# Patient Record
Sex: Female | Born: 1935
Health system: Southern US, Community
[De-identification: ages and names within clinical notes are randomized; demographics above are authoritative.]

## PROBLEM LIST (undated history)

## (undated) DIAGNOSIS — E7800 Pure hypercholesterolemia, unspecified: Secondary | ICD-10-CM

## (undated) DIAGNOSIS — E079 Disorder of thyroid, unspecified: Secondary | ICD-10-CM

## (undated) DIAGNOSIS — I10 Essential (primary) hypertension: Secondary | ICD-10-CM

## (undated) HISTORY — PX: HERNIA REPAIR: SHX51

## (undated) HISTORY — PX: CHOLECYSTECTOMY: SHX55

## (undated) HISTORY — PX: APPENDECTOMY: SHX54

---

## 1997-11-22 ENCOUNTER — Other Ambulatory Visit: Admission: RE | Admit: 1997-11-22 | Discharge: 1997-11-22 | Payer: Self-pay | Admitting: Obstetrics and Gynecology

## 1998-07-19 ENCOUNTER — Other Ambulatory Visit: Admission: RE | Admit: 1998-07-19 | Discharge: 1998-07-19 | Payer: Self-pay | Admitting: Obstetrics and Gynecology

## 1998-09-26 ENCOUNTER — Encounter: Payer: Self-pay | Admitting: Family Medicine

## 1998-09-26 ENCOUNTER — Ambulatory Visit (HOSPITAL_COMMUNITY): Admission: RE | Admit: 1998-09-26 | Discharge: 1998-09-26 | Payer: Self-pay | Admitting: Family Medicine

## 1998-10-25 ENCOUNTER — Encounter: Payer: Self-pay | Admitting: Surgery

## 1998-10-28 ENCOUNTER — Encounter (INDEPENDENT_AMBULATORY_CARE_PROVIDER_SITE_OTHER): Payer: Self-pay | Admitting: Specialist

## 1998-10-28 ENCOUNTER — Observation Stay (HOSPITAL_COMMUNITY): Admission: RE | Admit: 1998-10-28 | Discharge: 1998-10-29 | Payer: Self-pay | Admitting: Surgery

## 1998-12-24 ENCOUNTER — Other Ambulatory Visit: Admission: RE | Admit: 1998-12-24 | Discharge: 1998-12-24 | Payer: Self-pay | Admitting: Obstetrics and Gynecology

## 1999-12-25 ENCOUNTER — Other Ambulatory Visit: Admission: RE | Admit: 1999-12-25 | Discharge: 1999-12-25 | Payer: Self-pay | Admitting: Obstetrics and Gynecology

## 2000-12-28 ENCOUNTER — Other Ambulatory Visit: Admission: RE | Admit: 2000-12-28 | Discharge: 2000-12-28 | Payer: Self-pay | Admitting: Obstetrics and Gynecology

## 2001-02-02 ENCOUNTER — Ambulatory Visit (HOSPITAL_COMMUNITY): Admission: RE | Admit: 2001-02-02 | Discharge: 2001-02-02 | Payer: Self-pay | Admitting: Obstetrics and Gynecology

## 2001-02-02 ENCOUNTER — Encounter (INDEPENDENT_AMBULATORY_CARE_PROVIDER_SITE_OTHER): Payer: Self-pay

## 2001-05-24 ENCOUNTER — Encounter: Payer: Self-pay | Admitting: Family Medicine

## 2001-05-24 ENCOUNTER — Encounter: Admission: RE | Admit: 2001-05-24 | Discharge: 2001-05-24 | Payer: Self-pay | Admitting: Family Medicine

## 2001-08-31 ENCOUNTER — Ambulatory Visit (HOSPITAL_COMMUNITY): Admission: RE | Admit: 2001-08-31 | Discharge: 2001-08-31 | Payer: Self-pay | Admitting: Gastroenterology

## 2002-01-23 ENCOUNTER — Other Ambulatory Visit: Admission: RE | Admit: 2002-01-23 | Discharge: 2002-01-23 | Payer: Self-pay | Admitting: Obstetrics and Gynecology

## 2002-03-24 ENCOUNTER — Inpatient Hospital Stay (HOSPITAL_COMMUNITY): Admission: AD | Admit: 2002-03-24 | Discharge: 2002-03-27 | Payer: Self-pay | Admitting: Ophthalmology

## 2002-03-24 ENCOUNTER — Encounter: Payer: Self-pay | Admitting: Ophthalmology

## 2002-03-25 ENCOUNTER — Encounter: Payer: Self-pay | Admitting: Ophthalmology

## 2003-01-29 ENCOUNTER — Other Ambulatory Visit: Admission: RE | Admit: 2003-01-29 | Discharge: 2003-01-29 | Payer: Self-pay | Admitting: Obstetrics and Gynecology

## 2006-09-07 ENCOUNTER — Encounter (INDEPENDENT_AMBULATORY_CARE_PROVIDER_SITE_OTHER): Payer: Self-pay | Admitting: Surgery

## 2006-09-07 ENCOUNTER — Ambulatory Visit (HOSPITAL_COMMUNITY): Admission: RE | Admit: 2006-09-07 | Discharge: 2006-09-07 | Payer: Self-pay | Admitting: Surgery

## 2008-01-26 ENCOUNTER — Encounter: Admission: RE | Admit: 2008-01-26 | Discharge: 2008-01-26 | Payer: Self-pay | Admitting: Family Medicine

## 2008-10-05 ENCOUNTER — Encounter: Admission: RE | Admit: 2008-10-05 | Discharge: 2008-10-05 | Payer: Self-pay | Admitting: Family Medicine

## 2010-04-30 ENCOUNTER — Other Ambulatory Visit: Payer: Self-pay | Admitting: Dermatology

## 2010-06-05 ENCOUNTER — Other Ambulatory Visit: Payer: Self-pay | Admitting: Dermatology

## 2010-07-01 NOTE — Op Note (Signed)
[name], [name]                  ACCOUNT NO.:  [id number]   MEDICAL RECORD NO.:  [id number]          PATIENT TYPE:  AMB   LOCATION:  DAY                          FACILITY:  [facility]   PHYSICIAN:  Thornton Park. Daphine Deutscher, MD  DATE OF BIRTH:  [birth year]   DATE OF PROCEDURE:  09/07/2006  DATE OF DISCHARGE:                               OPERATIVE REPORT   PREOPERATIVE DIAGNOSIS:  Right inguinal hernia and recurrent lipoma of  back.   POSTOPERATIVE DIAGNOSIS:  Right indirect inguinal hernia with lipoma  back.   PROCEDURE:  Open repair right inguinal hernia with mesh and excision of  lipoma of back.   SURGEON:  Thornton Park. Daphine Deutscher, M.D.   ANESTHESIA:  General, first in the supine position and then rolled up  lateral.   DESCRIPTION OF PROCEDURE:  The patient was taken to room 1 on September 07, 2006, and given general anesthesia.  The abdomen was prepped with the  TechniCare and draped sterilely.  A small oblique incision was made and  I carried this down to the external oblique which I incised.  I  mobilized the cord structures and found the indirect hernia opened it  and then cut off the excess sac and reduced it up in the abdomen.  I  also excised the gubernaculum and tucked all that up inside.  I then  repaired the floor with a piece of mesh.  This was sewn in with  interrupted 2-0 Prolene sutures.  The external oblique was closed with a  running 2-0 Vicryl, 4-0 Vicryl was used in the subcutaneous tissue with  Benzoin and Steri-Strips.   Next, I rolled the patient over on the left side and prepped the back  and incised the previous incision and I removed multilobulated lipoma.  Bleeding was controlled.  I closed with 4-0 Vicryl and Dermabond.  The  patient can go home and will be followed up in the office in several  weeks.      Thornton Park Daphine Deutscher, MD  Electronically Signed     MBM/MEDQ  D:  09/07/2006  T:  09/07/2006  Job:  [id number]

## 2010-07-04 NOTE — Op Note (Signed)
NAMEMICKY, OVERTURF                            ACCOUNT NO.:  [id number]   MEDICAL RECORD NO.:  [id number]                   PATIENT TYPE:  OIB   LOCATION:  2550                                 FACILITY:  [facility]   PHYSICIAN:  Alford Highland. Rankin, M.D.                DATE OF BIRTH:  01-10-1931   DATE OF PROCEDURE:  03/24/2002  DATE OF DISCHARGE:                                 OPERATIVE REPORT   PREOPERATIVE DIAGNOSIS:  Dense vitreous hemorrhage of the right eye.   POSTOPERATIVE DIAGNOSES:  1. Dense vitreous hemorrhage of the right eye.  2. 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   PROCEDURE:  Pars plana vitrectomy of the right eye.   SURGEON:  Alford Highland. Rankin, M.D.   ANESTHESIA:  General endotracheal anesthesia.   INDICATION FOR PROCEDURE:  The patient is a 77 year old woman who had dense,  nonclearing vitreous hemorrhage over the last five days, unknown etiology,  who has a history of pathologic high myopia with an increase axial length of  eye and a high risk for retinal tear with occult break as an underlying  etiology for this dense, nonclearing vitreous hemorrhage.   The patient understands this is an attempt to clear the visual axis.  She  also understands the limitations of vision will be based upon the underlying  cause of her vision loss.  She also understands the risk of developing a  retinal tear or detachment even after surgery.  She understands these risks  and wishes to proceed.  She also understands the rare occurrence of death  and losses to the eye, including but not limited to hemorrhage, infection,  scarring, need for further surgery, no change in vision, loss of vision, and  progressive disease despite intervention.  Appropriate signed consent was  obtained.   DESCRIPTION OF PROCEDURE:  The patient was taken to the operating room.  In  the operating room general endotracheal anesthesia was  instituted without  difficulty.  The right periocular region was sterilely prepped and draped in  the usual ophthalmic fashion.  A lid speculum applied.  A conjunctival  peritomy fashioned temporally and superonasally.  A 4 mm infusion cannula  was then secured 3.5 mm posterior to the limbus in the inferotemporal  quadrant.  Placement in the vitreous cavity verified visually.  Superior  sclerotomies then fashioned.  Wall microscope placed in position with the  BIOM attached.  Core vitrectomy was then begun.  Notable findings were of  incarcerated hemorrhage into the vitreous base as well as of the  midvitreous.  This was removed.  Spontaneous posterior vitreous detachment  had previously occurred.  Vitreous skirt trimmed 360 degrees.  Clearance of  the preretinal hemorrhage allowed visualization of a retinal arteriolar  macroaneurysm, which apparently had ruptured, and there were signs of early  fibrosis along the superotemporal vascular arcade, with adjacent limited  intraretinal hemorrhage and subretinal hemorrhage but not into the foveal  region at this time.  Preretinal hemorrhage was aspirated.  Scleral  depression was then used inferiorly and peripherally to gently trim the  vitreous base with the vitrectomy instrumentation.  Scleral depression was  then used and indirect ophthalmoscopy was then used to confirm that there  were no retinal holes or tears.  There were no retinal holes or tears  peripherally.  No further treatment was then thus needed or required.  Vitreous wick was confirmed absent as the sclerotomies were then closed with  7-0 Vicryl suture superiorly.  The infusion removed and similarly closed  with 7-0 Vicryl suture.  Conjunctiva closed with 7-0 Vicryl suture.  Subconjunctival injection of  antibiotic and steroid applied.  The patient tolerated the procedure very  well.  A sterile patch and Fox shield applied to the right eye.  She was  then taken to the recovery  room in good and stable condition.                                                 Alford Highland Rankin, M.D.    GAR/MEDQ  D:  03/24/2002  T:  03/24/2002  Job:  [id number]

## 2010-07-04 NOTE — Procedures (Signed)
[facility]  Patient:    ILO, BEAMON Visit Number: [id number] MRN: [id number]          Service Type: END Location: ENDO Attending Physician:  Dennison Bulla Ii Dictated by:   Verlin Grills, M.D. Proc. Date: 08/31/01 Admit Date:  08/31/2001 Discharge Date: 08/31/2001   CC:         Desma Maxim, M.D.  Sherry A. Rosalio Macadamia, M.D.   Procedure Report  PROCEDURE:  Colonoscopy.  REFERRING PHYSICIANS: 1. Desma Maxim, M.D. 2. Cordelia Pen A. Rosalio Macadamia, M.D.  INDICATION FOR PROCEDURE:  Ms. Tyjah Hai is a 77 year old female born 1931-01-09. Ms. Leclaire is referred for a screening colonoscopy with polypectomy to prevent colon cancer. In June 1997, proctocolonoscopy to the cecum revealed left colonic diverticulosis but no endoscopic evidence for the presence of colorectal neoplasia.  ENDOSCOPIST:  Verlin Grills, M.D.  PREMEDICATION:  Versed 5 mg, Demerol 50 mg.  ENDOSCOPE:  Olympus pediatric colonoscope.  DESCRIPTION OF PROCEDURE:  After obtaining informed consent, the patient was placed in the left lateral decubitus position. I administered intravenous Demerol and intravenous Versed to achieve conscious sedation for the procedure. The patients cardiac rhythm, oxygen saturation and blood pressure were monitored throughout the procedure and documented in the medical record.  Anal inspection was normal. Digital rectal exam was normal. The Olympus pediatric video colonoscope was introduced into the rectum and easily advanced to the cecum. Colonic preparation for the exam today was excellent.  RECTUM:  Normal.  SIGMOID COLON/DESCENDING COLON:  Extensive colonic diverticulosis.  SPLENIC FLEXURE:  Normal.  TRANSVERSE COLON:  Normal.  HEPATIC FLEXURE:  Normal.  ASCENDING COLON:  Normal.  CECUM/ILEOCECAL VALVE:  Normal.  ASSESSMENT:  Left colonic diverticulosis; otherwise normal proctocolonoscopy to the cecum. No  endoscopic evidence for the presence of colorectal neoplasia.  RECOMMENDATIONS:  Yearly stool Hemoccult testing; if the patient has no signs of gastrointestinal bleeding or no symptoms to suggest colorectal neoplasia, she should undergo a repeat colonoscopy in approximately 10 years. Dictated by:   Verlin Grills, M.D. Attending Physician:  Dennison Bulla Ii DD:  08/31/01 TD:  09/03/01 Job: [id number]

## 2010-07-04 NOTE — Discharge Summary (Signed)
   [name], [name]                            ACCOUNT NO.:  [id number]   MEDICAL RECORD NO.:  [id number]                   PATIENT TYPE:  INP   LOCATION:  3112                                 FACILITY:  [facility]   PHYSICIAN:  Kelly Highland. [name], M.D.                DATE OF BIRTH:  Apr 14, 1930   DATE OF ADMISSION:  03/24/2002  DATE OF DISCHARGE:  03/27/2002                                 DISCHARGE SUMMARY   ADMISSION DIAGNOSIS:  Dense vitreous hemorrhage of the right eye.   DISCHARGE DIAGNOSES:  1. Dense vitreous hemorrhage of the right eye.  2. 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  ICD 9362.15.  3. Hypertension.  4. Arthritis, rheumatoid.  5. Pulmonary difficulties presumably secondary to anesthetic, postanesthetic     care and/or inadvertent injection of intravenous antibiotic.   PROCEDURE PERFORMED:  Pars plana vitrectomy with removal of dense vitreous  hemorrhage of the right eye, performed on March 24, 2002 under general  anesthesia.   LABORATORY DATA:  Early chest x-ray findings of negative pressure pulmonary  injury.  __________ the diagnosis will be delineated by the critical care  physicians.  Subsequent resolution over a 36 to 48 hour period.   COMPLICATIONS:  Reintubation of the patient in the PACU.   DISCHARGE MEDICATIONS:  1. Resumption of home medications.  2. Ocular medications will be Ocuflox, scopolamine 0.25% and Pred Forte 1%     to be used topically to the right eye four times daily each.   Wound care will be sterile patch and Fox shield at night time and optional  during the day.  Restriction and limitations of activity include no heavy  lifting, bending to prevent straining and that includes the avoidance of  constipation and these were reviewed with the patient.   Further medications per Dr. Marcos Eke are Protonix 40 mg one daily for  reflux.   Followup care will be on March 31, 2002 in the morning.              [name], M.D.    GAR/MEDQ  D:  03/27/2002  T:  03/27/2002  Job:  [id number]

## 2010-07-04 NOTE — Op Note (Signed)
[facility]  Patient:    LATOIA, EYSTER Visit Number: [id number] MRN: [id number]          Service Type: DSU Location: [facility] Attending Physician:  Morene Antu Dictated by:   Sherry A. Rosalio Macadamia, M.D. Proc. Date: 02/02/01 Admit Date:  02/02/2001                             Operative Report  PREOPERATIVE DIAGNOSES:       Postmenopausal bleeding, thickened endometrium.  POSTOPERATIVE DIAGNOSES:      Postmenopausal bleeding, thickened endometrium.  PROCEDURE:                    Dilatation and curettage, hysteroscopy with resectoscope.  SURGEON:                      Sherry A. Rosalio Macadamia, M.D.  ANESTHESIA:                   General.  INDICATIONS:                  This is a 77 year old G3, P3-0-0-3 woman who has been on hormones for 15 years.  Patient has no vaginal bleeding when she takes Prempro 5 mg; however, when she was on Prempro 2.5 mg she had spotting so from June 2002 to November 2002 she had no spotting; however, she had frequent irregular spotting from January 2002 to June 2002.  Because of a history of frequent spotting, ultrasound was performed.  Thickened endometrium was seen and therefore the patient is brought to the operating room for [facility], hysteroscopy with resectoscope.  FINDINGS:                     Normal sized anteflexed uterus with multiple polypoid areas on the lower posterior wall of the endometrium.  No adnexal mass.  PROCEDURE:                    Patient was brought into the operating room, given adequate general anesthesia.  She was placed in the dorsal lithotomy position.  Her perineum and vagina were washed with Hibiclens.  Patient was then draped in a sterile fashion.  Speculum was placed in the vagina.  A paracervical block was administered with 1% Nesacaine.  Anterior lip of the cervix was grasped with a single tooth tenaculum.  Cervix was sounded.  Cervix was dilated with Pratt dilators to a #31.   Hysteroscope was easily introduced into the endometrial cavity.  Pictures were obtained.  Using double loop right angle resector, the polypoid tissue was removed and samples of endometrial tissue were taken circumferentially.  Adequate hemostasis was present. Pictures were obtained.  All instruments were removed from the vagina. Patient was taken out of the dorsal lithotomy position.  She was awakened. She was moved from the operating table to a stretcher in stable condition.  COMPLICATIONS:                None.  ESTIMATED BLOOD LOSS:         Less than 5 cc.  FLUIDS:                       Sorbitol differential unable to measure because of most of the fluid spilling on the floor. Dictated by:   Sherry A. Rosalio Macadamia, M.D. Attending Physician:  Morene Antu  DD:  02/02/01 TD:  02/03/01 Job: [id number]

## 2010-07-04 NOTE — H&P (Signed)
   NAMEVIRGENE, [name]                            ACCOUNT NO.:  [id number]   MEDICAL RECORD NO.:  [id number]                   PATIENT TYPE:  INP   LOCATION:  3112                                 FACILITY:  [facility]   PHYSICIAN:  Alford Highland. Rankin, M.D.                DATE OF BIRTH:  12/10/1930   DATE OF ADMISSION:  03/24/2002  DATE OF DISCHARGE:                                HISTORY & PHYSICAL   ADMISSION DIAGNOSIS:  Dense vitreous hemorrhage to the right eye, etiology  unknown, but suspected underlying retinal arterial macular aneurysm of the  right eye.   PAST MEDICAL HISTORY:  1. Hypertension.  2. Arthritis, rheumatoid.   ALLERGIES:  X-ray dyes.   CURRENT MEDICATIONS:  1. Actonel once weekly on Sunday.  2. Indapamide daily for hypertension.   PAST SURGICAL HISTORY:  1. Cholecystectomy.  2. Cataract surgery of the right eye. Vision in the right motion is hand     motion. Left eye is 20/20.   PHYSICAL EXAMINATION:  VITAL SIGNS:  Blood pressure 130/80, pulse 76,  respirations 16.  GENERAL:  The patient is alert and oriented in no distress. The general  medical examination is unremarkable.   PLAN:  The surgery is for par plana vitrectomy of the right eye to be  performed on the afternoon of 03/24/02.                                               Alford Highland Rankin, M.D.    GAR/MEDQ  D:  03/27/2002  T:  03/27/2002  Job:  [id number]

## 2010-12-01 LAB — BASIC METABOLIC PANEL
Calcium: 10.2
GFR calc Af Amer: >60
GFR calc non Af Amer: >60
Potassium: 4.7
Sodium: 142

## 2010-12-01 LAB — HEMOGLOBIN AND HEMATOCRIT, BLOOD
HCT: 41.5
Hemoglobin: 14.3

## 2012-07-28 ENCOUNTER — Ambulatory Visit
Admission: RE | Admit: 2012-07-28 | Discharge: 2012-07-28 | Disposition: A | Discharge status: Home / Self Care | Payer: Medicare Other | Source: Ambulatory Visit | Attending: Family Medicine | Admitting: Family Medicine

## 2012-07-28 ENCOUNTER — Other Ambulatory Visit: Payer: Self-pay | Admitting: Family Medicine

## 2012-07-28 DIAGNOSIS — M25531 Pain in right wrist: Secondary | ICD-10-CM

## 2012-12-06 ENCOUNTER — Other Ambulatory Visit: Payer: Self-pay | Admitting: Gastroenterology

## 2013-01-25 ENCOUNTER — Other Ambulatory Visit: Payer: Self-pay | Admitting: Family Medicine

## 2014-08-03 ENCOUNTER — Other Ambulatory Visit: Payer: Self-pay | Admitting: Family Medicine

## 2014-08-03 DIAGNOSIS — N907 Vulvar cyst: Secondary | ICD-10-CM

## 2014-08-06 ENCOUNTER — Ambulatory Visit
Admission: RE | Admit: 2014-08-06 | Discharge: 2014-08-06 | Disposition: A | Discharge status: Home / Self Care | Payer: Medicare Other | Source: Ambulatory Visit | Attending: Family Medicine | Admitting: Family Medicine

## 2014-08-06 DIAGNOSIS — N907 Vulvar cyst: Secondary | ICD-10-CM

## 2015-05-01 ENCOUNTER — Ambulatory Visit
Admission: RE | Admit: 2015-05-01 | Discharge: 2015-05-01 | Disposition: A | Discharge status: Home / Self Care | Payer: Medicare Other | Source: Ambulatory Visit | Attending: Family Medicine | Admitting: Family Medicine

## 2015-05-01 ENCOUNTER — Other Ambulatory Visit: Payer: Self-pay | Admitting: Family Medicine

## 2015-05-01 DIAGNOSIS — M549 Dorsalgia, unspecified: Secondary | ICD-10-CM | POA: Diagnosis not present

## 2015-05-01 DIAGNOSIS — R002 Palpitations: Secondary | ICD-10-CM

## 2015-05-01 DIAGNOSIS — R5383 Other fatigue: Secondary | ICD-10-CM | POA: Diagnosis not present

## 2015-05-07 DIAGNOSIS — R0602 Shortness of breath: Secondary | ICD-10-CM | POA: Diagnosis not present

## 2015-05-07 DIAGNOSIS — R002 Palpitations: Secondary | ICD-10-CM | POA: Diagnosis not present

## 2015-05-07 DIAGNOSIS — R531 Weakness: Secondary | ICD-10-CM | POA: Diagnosis not present

## 2015-05-09 DIAGNOSIS — N183 Chronic kidney disease, stage 3 (moderate): Secondary | ICD-10-CM | POA: Diagnosis not present

## 2015-05-09 DIAGNOSIS — N179 Acute kidney failure, unspecified: Secondary | ICD-10-CM | POA: Diagnosis not present

## 2015-06-18 DIAGNOSIS — H40013 Open angle with borderline findings, low risk, bilateral: Secondary | ICD-10-CM | POA: Diagnosis not present

## 2015-09-30 DIAGNOSIS — Z1231 Encounter for screening mammogram for malignant neoplasm of breast: Secondary | ICD-10-CM | POA: Diagnosis not present

## 2015-11-27 DIAGNOSIS — Z23 Encounter for immunization: Secondary | ICD-10-CM | POA: Diagnosis not present

## 2015-12-09 DIAGNOSIS — R300 Dysuria: Secondary | ICD-10-CM | POA: Diagnosis not present

## 2015-12-11 DIAGNOSIS — H5213 Myopia, bilateral: Secondary | ICD-10-CM | POA: Diagnosis not present

## 2016-02-13 DIAGNOSIS — M810 Age-related osteoporosis without current pathological fracture: Secondary | ICD-10-CM | POA: Diagnosis not present

## 2016-02-13 DIAGNOSIS — K5730 Diverticulosis of large intestine without perforation or abscess without bleeding: Secondary | ICD-10-CM | POA: Diagnosis not present

## 2016-02-13 DIAGNOSIS — I129 Hypertensive chronic kidney disease with stage 1 through stage 4 chronic kidney disease, or unspecified chronic kidney disease: Secondary | ICD-10-CM | POA: Diagnosis not present

## 2016-02-13 DIAGNOSIS — Z0000 Encounter for general adult medical examination without abnormal findings: Secondary | ICD-10-CM | POA: Diagnosis not present

## 2016-02-13 DIAGNOSIS — F411 Generalized anxiety disorder: Secondary | ICD-10-CM | POA: Diagnosis not present

## 2016-02-13 DIAGNOSIS — N183 Chronic kidney disease, stage 3 (moderate): Secondary | ICD-10-CM | POA: Diagnosis not present

## 2016-02-13 DIAGNOSIS — E782 Mixed hyperlipidemia: Secondary | ICD-10-CM | POA: Diagnosis not present

## 2016-02-13 DIAGNOSIS — E039 Hypothyroidism, unspecified: Secondary | ICD-10-CM | POA: Diagnosis not present

## 2016-02-13 DIAGNOSIS — R7301 Impaired fasting glucose: Secondary | ICD-10-CM | POA: Diagnosis not present

## 2016-05-01 DIAGNOSIS — H40013 Open angle with borderline findings, low risk, bilateral: Secondary | ICD-10-CM | POA: Diagnosis not present

## 2016-06-02 DIAGNOSIS — M109 Gout, unspecified: Secondary | ICD-10-CM | POA: Diagnosis not present

## 2016-07-30 DIAGNOSIS — H40013 Open angle with borderline findings, low risk, bilateral: Secondary | ICD-10-CM | POA: Diagnosis not present

## 2016-08-27 DIAGNOSIS — R7301 Impaired fasting glucose: Secondary | ICD-10-CM | POA: Diagnosis not present

## 2016-08-27 DIAGNOSIS — E039 Hypothyroidism, unspecified: Secondary | ICD-10-CM | POA: Diagnosis not present

## 2016-08-27 DIAGNOSIS — I129 Hypertensive chronic kidney disease with stage 1 through stage 4 chronic kidney disease, or unspecified chronic kidney disease: Secondary | ICD-10-CM | POA: Diagnosis not present

## 2016-08-27 DIAGNOSIS — N183 Chronic kidney disease, stage 3 (moderate): Secondary | ICD-10-CM | POA: Diagnosis not present

## 2016-11-20 DIAGNOSIS — M8588 Other specified disorders of bone density and structure, other site: Secondary | ICD-10-CM | POA: Diagnosis not present

## 2016-11-20 DIAGNOSIS — M810 Age-related osteoporosis without current pathological fracture: Secondary | ICD-10-CM | POA: Diagnosis not present

## 2016-11-20 DIAGNOSIS — Z1231 Encounter for screening mammogram for malignant neoplasm of breast: Secondary | ICD-10-CM | POA: Diagnosis not present

## 2016-11-24 DIAGNOSIS — Z23 Encounter for immunization: Secondary | ICD-10-CM | POA: Diagnosis not present

## 2016-12-10 DIAGNOSIS — H5213 Myopia, bilateral: Secondary | ICD-10-CM | POA: Diagnosis not present

## 2016-12-29 DIAGNOSIS — M810 Age-related osteoporosis without current pathological fracture: Secondary | ICD-10-CM | POA: Diagnosis not present

## 2017-02-15 DIAGNOSIS — N183 Chronic kidney disease, stage 3 (moderate): Secondary | ICD-10-CM | POA: Diagnosis not present

## 2017-02-15 DIAGNOSIS — Z0000 Encounter for general adult medical examination without abnormal findings: Secondary | ICD-10-CM | POA: Diagnosis not present

## 2017-02-15 DIAGNOSIS — I129 Hypertensive chronic kidney disease with stage 1 through stage 4 chronic kidney disease, or unspecified chronic kidney disease: Secondary | ICD-10-CM | POA: Diagnosis not present

## 2017-02-15 DIAGNOSIS — R7301 Impaired fasting glucose: Secondary | ICD-10-CM | POA: Diagnosis not present

## 2017-05-05 DIAGNOSIS — H40013 Open angle with borderline findings, low risk, bilateral: Secondary | ICD-10-CM | POA: Diagnosis not present

## 2017-06-29 DIAGNOSIS — M810 Age-related osteoporosis without current pathological fracture: Secondary | ICD-10-CM | POA: Diagnosis not present

## 2017-08-30 DIAGNOSIS — E039 Hypothyroidism, unspecified: Secondary | ICD-10-CM | POA: Diagnosis not present

## 2017-08-30 DIAGNOSIS — I129 Hypertensive chronic kidney disease with stage 1 through stage 4 chronic kidney disease, or unspecified chronic kidney disease: Secondary | ICD-10-CM | POA: Diagnosis not present

## 2017-08-30 DIAGNOSIS — R7301 Impaired fasting glucose: Secondary | ICD-10-CM | POA: Diagnosis not present

## 2017-08-30 DIAGNOSIS — N183 Chronic kidney disease, stage 3 (moderate): Secondary | ICD-10-CM | POA: Diagnosis not present

## 2017-09-29 DIAGNOSIS — L821 Other seborrheic keratosis: Secondary | ICD-10-CM | POA: Diagnosis not present

## 2017-11-22 DIAGNOSIS — Z1231 Encounter for screening mammogram for malignant neoplasm of breast: Secondary | ICD-10-CM | POA: Diagnosis not present

## 2017-12-01 DIAGNOSIS — Z23 Encounter for immunization: Secondary | ICD-10-CM | POA: Diagnosis not present

## 2017-12-09 DIAGNOSIS — H5213 Myopia, bilateral: Secondary | ICD-10-CM | POA: Diagnosis not present

## 2018-01-03 DIAGNOSIS — M810 Age-related osteoporosis without current pathological fracture: Secondary | ICD-10-CM | POA: Diagnosis not present

## 2018-01-03 DIAGNOSIS — I129 Hypertensive chronic kidney disease with stage 1 through stage 4 chronic kidney disease, or unspecified chronic kidney disease: Secondary | ICD-10-CM | POA: Diagnosis not present

## 2018-03-10 DIAGNOSIS — R7301 Impaired fasting glucose: Secondary | ICD-10-CM | POA: Diagnosis not present

## 2018-03-10 DIAGNOSIS — N183 Chronic kidney disease, stage 3 (moderate): Secondary | ICD-10-CM | POA: Diagnosis not present

## 2018-03-10 DIAGNOSIS — I129 Hypertensive chronic kidney disease with stage 1 through stage 4 chronic kidney disease, or unspecified chronic kidney disease: Secondary | ICD-10-CM | POA: Diagnosis not present

## 2018-03-10 DIAGNOSIS — Z0000 Encounter for general adult medical examination without abnormal findings: Secondary | ICD-10-CM | POA: Diagnosis not present

## 2018-03-17 DIAGNOSIS — H40013 Open angle with borderline findings, low risk, bilateral: Secondary | ICD-10-CM | POA: Diagnosis not present

## 2018-04-04 ENCOUNTER — Other Ambulatory Visit: Payer: Self-pay

## 2018-04-04 ENCOUNTER — Emergency Department (HOSPITAL_COMMUNITY)
Admission: EM | Admit: 2018-04-04 | Discharge: 2018-04-05 | Disposition: A | Discharge status: Home / Self Care | Payer: Medicare Other | Attending: Emergency Medicine | Admitting: Emergency Medicine

## 2018-04-04 DIAGNOSIS — E860 Dehydration: Secondary | ICD-10-CM

## 2018-04-04 DIAGNOSIS — R319 Hematuria, unspecified: Secondary | ICD-10-CM | POA: Insufficient documentation

## 2018-04-04 DIAGNOSIS — R1111 Vomiting without nausea: Secondary | ICD-10-CM | POA: Diagnosis not present

## 2018-04-04 DIAGNOSIS — Z79899 Other long term (current) drug therapy: Secondary | ICD-10-CM | POA: Diagnosis not present

## 2018-04-04 DIAGNOSIS — R112 Nausea with vomiting, unspecified: Secondary | ICD-10-CM | POA: Diagnosis not present

## 2018-04-04 DIAGNOSIS — R197 Diarrhea, unspecified: Secondary | ICD-10-CM | POA: Diagnosis not present

## 2018-04-04 DIAGNOSIS — N390 Urinary tract infection, site not specified: Secondary | ICD-10-CM | POA: Insufficient documentation

## 2018-04-04 DIAGNOSIS — R110 Nausea: Secondary | ICD-10-CM | POA: Diagnosis not present

## 2018-04-04 DIAGNOSIS — I10 Essential (primary) hypertension: Secondary | ICD-10-CM | POA: Insufficient documentation

## 2018-04-04 DIAGNOSIS — E876 Hypokalemia: Secondary | ICD-10-CM

## 2018-04-04 DIAGNOSIS — R0902 Hypoxemia: Secondary | ICD-10-CM | POA: Diagnosis not present

## 2018-04-04 LAB — CBC WITH DIFFERENTIAL/PLATELET
ABS IMMATURE GRANULOCYTES: 0.03 10*3/uL (ref 0.00–0.07)
Basophils Absolute: 0 10*3/uL (ref 0.0–0.1)
Basophils Relative: 0 %
Eosinophils Absolute: 0 10*3/uL (ref 0.0–0.5)
Eosinophils Relative: 0 %
HCT: 34.5 % — ABNORMAL LOW (ref 36.0–46.0)
Hemoglobin: 11.2 g/dL — ABNORMAL LOW (ref 12.0–15.0)
Immature Granulocytes: 0 %
LYMPHS ABS: 0.6 10*3/uL — AB (ref 0.7–4.0)
LYMPHS PCT: 9 %
MCH: 30.5 pg (ref 26.0–34.0)
MCHC: 32.5 g/dL (ref 30.0–36.0)
MCV: 94 fL (ref 80.0–100.0)
MONO ABS: 0.2 10*3/uL (ref 0.1–1.0)
MONOS PCT: 3 %
NEUTROS ABS: 5.9 10*3/uL (ref 1.7–7.7)
Neutrophils Relative %: 88 %
Platelets: 158 10*3/uL (ref 150–400)
RBC: 3.67 MIL/uL — ABNORMAL LOW (ref 3.87–5.11)
RDW: 12.5 % (ref 11.5–15.5)
WBC: 6.7 10*3/uL (ref 4.0–10.5)
nRBC: 0 % (ref 0.0–0.2)

## 2018-04-04 MED ORDER — SODIUM CHLORIDE 0.9 % IV BOLUS
500.0000 mL | Freq: Once | INTRAVENOUS | Status: AC
  Administered 2018-04-04: 500 mL via INTRAVENOUS

## 2018-04-04 MED ORDER — LOPERAMIDE HCL 2 MG PO CAPS
4.0000 mg | ORAL_CAPSULE | Freq: Once | ORAL | Status: AC
  Administered 2018-04-04: 4 mg via ORAL
  Filled 2018-04-04: qty 2

## 2018-04-04 MED ORDER — SODIUM CHLORIDE 0.9 % IV BOLUS
1000.0000 mL | Freq: Once | INTRAVENOUS | Status: AC
  Administered 2018-04-04: 1000 mL via INTRAVENOUS

## 2018-04-04 MED ORDER — ONDANSETRON HCL 4 MG/2ML IJ SOLN
4.0000 mg | Freq: Once | INTRAMUSCULAR | Status: AC
  Administered 2018-04-04: 4 mg via INTRAVENOUS
  Filled 2018-04-04: qty 2

## 2018-04-04 NOTE — ED Notes (Signed)
Pt asked to provide urine specimen but is unable at this time. Pt knows to call staff when able to provide a specimen.

## 2018-04-04 NOTE — ED Triage Notes (Signed)
Pt came to the ED from home with complaints of nausea, vomiting and diarrhea.  Pt states that she has been having stomach cramps

## 2018-04-05 LAB — COMPREHENSIVE METABOLIC PANEL
ALBUMIN: 3.1 g/dL — AB (ref 3.5–5.0)
ALK PHOS: 43 U/L (ref 38–126)
ALT: 14 U/L (ref 0–44)
ANION GAP: 7 (ref 5–15)
AST: 14 U/L — ABNORMAL LOW (ref 15–41)
BILIRUBIN TOTAL: 0.7 mg/dL (ref 0.3–1.2)
BUN: 30 mg/dL — AB (ref 8–23)
CALCIUM: 7.6 mg/dL — AB (ref 8.9–10.3)
CO2: 21 mmol/L — ABNORMAL LOW (ref 22–32)
Chloride: 111 mmol/L (ref 98–111)
Creatinine, Ser: 0.93 mg/dL (ref 0.44–1.00)
GFR calc Af Amer: >60 mL/min (ref >60)
GFR, EST NON AFRICAN AMERICAN: 56 mL/min — AB (ref >60)
Glucose, Bld: 121 mg/dL — ABNORMAL HIGH (ref 70–99)
POTASSIUM: 2.6 mmol/L — AB (ref 3.5–5.1)
SODIUM: 139 mmol/L (ref 135–145)
TOTAL PROTEIN: 5.8 g/dL — AB (ref 6.5–8.1)

## 2018-04-05 LAB — URINALYSIS, ROUTINE W REFLEX MICROSCOPIC
BILIRUBIN URINE: NEGATIVE
Glucose, UA: NEGATIVE mg/dL
Ketones, ur: 5 mg/dL — AB
Nitrite: NEGATIVE
PH: 5 (ref 5.0–8.0)
Protein, ur: NEGATIVE mg/dL
SPECIFIC GRAVITY, URINE: 1.021 (ref 1.005–1.030)
WBC, UA: >50 WBC/hpf — ABNORMAL HIGH (ref 0–5)

## 2018-04-05 MED ORDER — CEPHALEXIN 500 MG PO CAPS: 500.0000 mg | ORAL_CAPSULE | Freq: Three times a day (TID) | ORAL | 0 refills | Status: DC

## 2018-04-05 MED ORDER — POTASSIUM CHLORIDE CRYS ER 20 MEQ PO TBCR: 20.0000 meq | EXTENDED_RELEASE_TABLET | Freq: Two times a day (BID) | ORAL | 0 refills | Status: AC

## 2018-04-05 MED ORDER — ONDANSETRON HCL 4 MG PO TABS: 4.0000 mg | ORAL_TABLET | Freq: Three times a day (TID) | ORAL | 0 refills | Status: DC | PRN

## 2018-04-05 MED ORDER — POTASSIUM CHLORIDE 10 MEQ/100ML IV SOLN
10.0000 meq | INTRAVENOUS | Status: AC
  Administered 2018-04-05 (×2): 10 meq via INTRAVENOUS
  Filled 2018-04-05 (×2): qty 100

## 2018-04-05 MED ORDER — CEPHALEXIN 500 MG PO CAPS
500.0000 mg | ORAL_CAPSULE | Freq: Once | ORAL | Status: AC
  Administered 2018-04-05: 500 mg via ORAL
  Filled 2018-04-05: qty 1

## 2018-04-05 MED ORDER — POTASSIUM CHLORIDE CRYS ER 20 MEQ PO TBCR
40.0000 meq | EXTENDED_RELEASE_TABLET | Freq: Once | ORAL | Status: AC
Start: 2018-04-05 — End: 2018-04-05
  Administered 2018-04-05: 40 meq via ORAL
  Filled 2018-04-05: qty 2

## 2018-04-05 MED ORDER — SODIUM CHLORIDE 0.9 % IV BOLUS
1000.0000 mL | Freq: Once | INTRAVENOUS | Status: AC
  Administered 2018-04-05: 1000 mL via INTRAVENOUS

## 2018-04-05 NOTE — ED Notes (Signed)
Date and time results received: 04/05/18 00:24  Test: Potassium  Critical Value: 2.6  Name of Provider Notified: Dr. Lars Mage   Orders Received? Or Actions Taken?: Continue to monitor patient and await for new orders.

## 2018-04-05 NOTE — ED Notes (Signed)
MD at bedside. 

## 2018-04-05 NOTE — ED Provider Notes (Signed)
[facility]-EMERGENCY DEPT Provider Note   CSN: [id number] Arrival date & time: 04/04/18  2228  Time seen 11:10 PM  History   Chief Complaint Chief Complaint  Patient presents with  . Nausea  . Abdominal Pain    HPI [name] is a 85 y.o. female.     HPI patient states 11 PM yesterday, February 17 she started having vomiting and diarrhea.  She estimates she has had between 10-20 episodes of each.  She states now she is having dry heaves.  She denies any blood in her vomiting or diarrhea.  She states her diarrhea is loose and watery.  Her daughter states her temperature was 100.2 tonight at 6:30 PM.  She has had some mild chills.  She complains of being weak but denies dizziness or lightheadedness.  She complains of abdominal cramping that is getting better.  She ate green beans, potatoes, and baked chicken before she got ill however her husband ate the same thing and he has been fine.  She had bronchitis about 2 weeks ago and she was given Magic mouthwash last week for sore throat.  She was not placed on antibiotics.  PCP Dr Pincus BadderK Shaw   No past medical history on file.  HTN High cholesterol Thyroid problems  There are no active problems to display for this patient.   The histories are not reviewed yet. Please review them in the "History" navigator section and refresh this SmartLink.   OB History   No obstetric history on file.      Home Medications    Prior to Admission medications   Medication Sig Start Date End Date Taking? Authorizing Provider  dorzolamide-timolol (COSOPT) 22.3-6.8 MG/ML ophthalmic solution Place 1 drop into both eyes 3 (three) times daily. 02/28/18  Yes [provider]  hydrochlorothiazide (HYDRODIURIL) 25 MG tablet Take 25 mg by mouth daily.   Yes [provider]  levothyroxine (SYNTHROID, LEVOTHROID) 50 MCG tablet Take 50 mcg by mouth daily before breakfast.   Yes [provider]  lisinopril  (PRINIVIL,ZESTRIL) 20 MG tablet Take 20 mg by mouth daily.   Yes [provider]  simvastatin (ZOCOR) 10 MG tablet Take 10 mg by mouth daily.   Yes [provider]  cephALEXin (KEFLEX) 500 MG capsule Take 1 capsule (500 mg total) by mouth 3 (three) times daily. 04/05/18   Devoria AlbeKnapp, Lee-Ann Gal, MD  ondansetron (ZOFRAN) 4 MG tablet Take 1 tablet (4 mg total) by mouth every 8 (eight) hours as needed for nausea or vomiting. 04/05/18   Devoria AlbeKnapp, Jonea Bukowski, MD  potassium chloride SA (K-DUR,KLOR-CON) 20 MEQ tablet Take 1 tablet (20 mEq total) by mouth 2 (two) times daily. 04/05/18   Devoria AlbeKnapp, Gwynneth Fabio, MD    Family History No family history on file.  Social History Social History   Tobacco Use  . Smoking status: Not on file  Substance Use Topics  . Alcohol use: Not on file  . Drug use: Not on file  She denies smoking She denies alcohol use Patient lives at home Patient lives with spouse   Allergies   Patient has no known allergies.   Review of Systems Review of Systems  All other systems reviewed and are negative.    Physical Exam Updated Vital Signs BP 121/61   Pulse 79   Temp 98.9 F (37.2 C) (Oral)   Resp 18   Ht 5\' 4"  (1.626 m)   Wt 62.6 kg   SpO2 97%   BMI 23.69 kg/m  Vital signs normal    Physical Exam Vitals signs and nursing note reviewed.  Constitutional:      Appearance: She is well-developed.  HENT:     Head: Normocephalic and atraumatic.     Right Ear: External ear normal.     Left Ear: External ear normal.     Nose: Nose normal.     Mouth/Throat:     Mouth: Mucous membranes are dry.  Eyes:     Extraocular Movements: Extraocular movements intact.     Conjunctiva/sclera: Conjunctivae normal.     Pupils: Pupils are equal, round, and reactive to light.  Neck:     Musculoskeletal: Normal range of motion and neck supple.  Cardiovascular:     Rate and Rhythm: Normal rate and regular rhythm.  Pulmonary:     Effort: Pulmonary effort is normal. No respiratory  distress.  Abdominal:     Palpations: Abdomen is soft.     Tenderness: There is no abdominal tenderness. There is no guarding or rebound.  Musculoskeletal: Normal range of motion.        General: No deformity.  Skin:    General: Skin is warm and dry.     Capillary Refill: Capillary refill takes less than 2 seconds.     Coloration: Skin is not pale.     Findings: No erythema.  Neurological:     General: No focal deficit present.     Mental Status: She is alert and oriented to person, place, and time.     Cranial Nerves: No cranial nerve deficit.  Psychiatric:        Mood and Affect: Mood normal.        Behavior: Behavior normal.        Thought Content: Thought content normal.      ED Treatments / Results  Labs (all labs ordered are listed, but only abnormal results are displayed) Results for orders placed or performed during the hospital encounter of 04/04/18  Comprehensive metabolic panel  Result Value Ref Range   Sodium 139 135 - 145 mmol/L   Potassium 2.6 (LL) 3.5 - 5.1 mmol/L   Chloride 111 98 - 111 mmol/L   CO2 21 (L) 22 - 32 mmol/L   Glucose, Bld 121 (H) 70 - 99 mg/dL   BUN 30 (H) 8 - 23 mg/dL   Creatinine, Ser 1.32 0.44 - 1.00 mg/dL   Calcium 7.6 (L) 8.9 - 10.3 mg/dL   Total Protein 5.8 (L) 6.5 - 8.1 g/dL   Albumin 3.1 (L) 3.5 - 5.0 g/dL   AST 14 (L) 15 - 41 U/L   ALT 14 0 - 44 U/L   Alkaline Phosphatase 43 38 - 126 U/L   Total Bilirubin 0.7 0.3 - 1.2 mg/dL   GFR calc non Af Amer 56 (L) >60 mL/min   GFR calc Af Amer >60 >60 mL/min   Anion gap 7 5 - 15  CBC with Differential  Result Value Ref Range   WBC 6.7 4.0 - 10.5 K/uL   RBC 3.67 (L) 3.87 - 5.11 MIL/uL   Hemoglobin 11.2 (L) 12.0 - 15.0 g/dL   HCT 44.0 (L) 10.2 - 72.5 %   MCV 94.0 80.0 - 100.0 fL   MCH 30.5 26.0 - 34.0 pg   MCHC 32.5 30.0 - 36.0 g/dL   RDW 36.6 44.0 - 34.7 %   Platelets 158 150 - 400 K/uL   nRBC 0.0 0.0 - 0.2 %   Neutrophils Relative % 88 %  Neutro Abs 5.9 1.7 - 7.7 K/uL    Lymphocytes Relative 9 %   Lymphs Abs 0.6 (L) 0.7 - 4.0 K/uL   Monocytes Relative 3 %   Monocytes Absolute 0.2 0.1 - 1.0 K/uL   Eosinophils Relative 0 %   Eosinophils Absolute 0.0 0.0 - 0.5 K/uL   Basophils Relative 0 %   Basophils Absolute 0.0 0.0 - 0.1 K/uL   Immature Granulocytes 0 %   Abs Immature Granulocytes 0.03 0.00 - 0.07 K/uL  Urinalysis, Routine w reflex microscopic  Result Value Ref Range   Color, Urine YELLOW YELLOW   APPearance HAZY (A) CLEAR   Specific Gravity, Urine 1.021 1.005 - 1.030   pH 5.0 5.0 - 8.0   Glucose, UA NEGATIVE NEGATIVE mg/dL   Hgb urine dipstick SMALL (A) NEGATIVE   Bilirubin Urine NEGATIVE NEGATIVE   Ketones, ur 5 (A) NEGATIVE mg/dL   Protein, ur NEGATIVE NEGATIVE mg/dL   Nitrite NEGATIVE NEGATIVE   Leukocytes,Ua LARGE (A) NEGATIVE   RBC / HPF 6-10 0 - 5 RBC/hpf   WBC, UA >50 (H) 0 - 5 WBC/hpf   Bacteria, UA RARE (A) NONE SEEN   Squamous Epithelial / LPF 0-5 0 - 5   Mucus PRESENT    Non Squamous Epithelial 0-5 (A) NONE SEEN   Vital signs normal except mild anemia, hypokalemia, elevated BUN consistent with dehydration, possible UTI    EKG EKG Interpretation  Date/Time:  Tuesday April 05 2018 00:55:31 EST Ventricular Rate:  88 PR Interval:    QRS Duration: 76 QT Interval:  435 QTC Calculation: 527 R Axis:   47 Text Interpretation:  Sinus rhythm Anteroseptal infarct, age indeterminate Prolonged QT interval No significant change since last tracing 03 Sep 2006 Confirmed by Devoria AlbeKnapp, Alphonsus Doyel ([id number]) on 04/05/2018 1:09:19 AM   Radiology No results found.  Procedures Procedures (including critical care time)  Medications Ordered in ED Medications  cephALEXin (KEFLEX) capsule 500 mg (has no administration in time range)  sodium chloride 0.9 % bolus 1,000 mL (0 mLs Intravenous Stopped 04/05/18 0046)  sodium chloride 0.9 % bolus 500 mL (0 mLs Intravenous Stopped 04/05/18 0047)  ondansetron (ZOFRAN) injection 4 mg (4 mg Intravenous Given  04/04/18 2331)  loperamide (IMODIUM) capsule 4 mg (4 mg Oral Given 04/04/18 2332)  potassium chloride SA (K-DUR,KLOR-CON) CR tablet 40 mEq (40 mEq Oral Given 04/05/18 0047)  potassium chloride 10 mEq in 100 mL IVPB (0 mEq Intravenous Stopped 04/05/18 0247)  sodium chloride 0.9 % bolus 1,000 mL (0 mLs Intravenous Stopped 04/05/18 0301)     Initial Impression / Assessment and Plan / ED Course  I have reviewed the triage vital signs and the nursing notes.  Pertinent labs & imaging results that were available during my care of the patient were reviewed by me and considered in my medical decision making (see chart for details).        Patient was given IV fluids, IV Zofran and was given oral Imodium.  She has taken nothing for her diarrhea at home.  Her daughter did give her Dramamine around 6:00 this evening.  Recheck at 12:30 AM patient noted to have significant hypokalemia from her vomiting and diarrhea.  EKG was done.  Patient was started on oral potassium and got IV potassium x2 rounds.  Recheck at 2:30 AM patient has had no more vomiting or diarrhea since she arrived.  She has not had any urinary output.  She was given number 3 L normal saline.  Recheck at  5 AM patient has had urinary output.  Her tongue is moist now.  She states she feels better.  She has had no vomiting or diarrhea in the ED.  Final Clinical Impressions(s) / ED Diagnoses   Final diagnoses:  Nausea vomiting and diarrhea  Dehydration  Hypokalemia  Urinary tract infection with hematuria, site unspecified    ED Discharge Orders         Ordered    ondansetron (ZOFRAN) 4 MG tablet  Every 8 hours PRN     04/05/18 0533    cephALEXin (KEFLEX) 500 MG capsule  3 times daily     04/05/18 0533    potassium chloride SA (K-DUR,KLOR-CON) 20 MEQ tablet  2 times daily     04/05/18 0534         Plan discharge  Devoria Albe, MD, Concha Pyo, MD 04/05/18 [phone]

## 2018-04-05 NOTE — Discharge Instructions (Addendum)
Drink plenty of fluids (clear liquids) then start a bland diet later this morning such as toast, crackers, jello, [employer] chicken noodle soup. Use the zofran for nausea or vomiting. Take imodium OTC for diarrhea. Avoid milk products until the diarrhea is gone.  Take the antibiotics until gone.  Please be rechecked if you get a high fever, you get dehydrated again, or you feel like you are getting worse.

## 2018-04-06 LAB — URINE CULTURE: Special Requests: NORMAL

## 2018-04-19 DIAGNOSIS — N3000 Acute cystitis without hematuria: Secondary | ICD-10-CM | POA: Diagnosis not present

## 2018-04-19 DIAGNOSIS — K529 Noninfective gastroenteritis and colitis, unspecified: Secondary | ICD-10-CM | POA: Diagnosis not present

## 2018-04-19 DIAGNOSIS — E876 Hypokalemia: Secondary | ICD-10-CM | POA: Diagnosis not present

## 2018-07-19 DIAGNOSIS — H40013 Open angle with borderline findings, low risk, bilateral: Secondary | ICD-10-CM | POA: Diagnosis not present

## 2018-09-05 DIAGNOSIS — I129 Hypertensive chronic kidney disease with stage 1 through stage 4 chronic kidney disease, or unspecified chronic kidney disease: Secondary | ICD-10-CM | POA: Diagnosis not present

## 2018-09-05 DIAGNOSIS — E039 Hypothyroidism, unspecified: Secondary | ICD-10-CM | POA: Diagnosis not present

## 2018-09-05 DIAGNOSIS — N183 Chronic kidney disease, stage 3 (moderate): Secondary | ICD-10-CM | POA: Diagnosis not present

## 2018-09-05 DIAGNOSIS — R7301 Impaired fasting glucose: Secondary | ICD-10-CM | POA: Diagnosis not present

## 2018-09-12 DIAGNOSIS — E039 Hypothyroidism, unspecified: Secondary | ICD-10-CM | POA: Diagnosis not present

## 2018-09-12 DIAGNOSIS — R7301 Impaired fasting glucose: Secondary | ICD-10-CM | POA: Diagnosis not present

## 2018-09-12 DIAGNOSIS — I129 Hypertensive chronic kidney disease with stage 1 through stage 4 chronic kidney disease, or unspecified chronic kidney disease: Secondary | ICD-10-CM | POA: Diagnosis not present

## 2018-09-12 DIAGNOSIS — N183 Chronic kidney disease, stage 3 (moderate): Secondary | ICD-10-CM | POA: Diagnosis not present

## 2018-11-22 DIAGNOSIS — H5213 Myopia, bilateral: Secondary | ICD-10-CM | POA: Diagnosis not present

## 2018-11-30 DIAGNOSIS — Z1231 Encounter for screening mammogram for malignant neoplasm of breast: Secondary | ICD-10-CM | POA: Diagnosis not present

## 2018-11-30 DIAGNOSIS — M810 Age-related osteoporosis without current pathological fracture: Secondary | ICD-10-CM | POA: Diagnosis not present

## 2018-12-10 DIAGNOSIS — Z23 Encounter for immunization: Secondary | ICD-10-CM | POA: Diagnosis not present

## 2019-03-01 ENCOUNTER — Ambulatory Visit: Payer: Medicare Other | Attending: Internal Medicine

## 2019-03-01 DIAGNOSIS — Z23 Encounter for immunization: Secondary | ICD-10-CM

## 2019-03-01 NOTE — Progress Notes (Signed)
   Covid-19 Vaccination Clinic  Name:  [name]    MRN: [id number] DOB: [birth year]  03/01/2019  [name] was observed post Covid-19 immunization for 15 minutes without incidence. She was provided with Vaccine Information Sheet and instruction to access the V-Safe system.   [name] was instructed to call 911 with any severe reactions post vaccine: Marland Kitchen Difficulty breathing  . Swelling of your face and throat  . A fast heartbeat  . A bad rash all over your body  . Dizziness and weakness    Immunizations Administered    Name Date Dose VIS Date Route   Pfizer COVID-19 Vaccine 03/01/2019 10:44 AM 0.3 mL 01/27/2019 Intramuscular   Manufacturer: [employer], [employer]   Lot: [id number]   NDC: [address]-9

## 2019-03-21 ENCOUNTER — Ambulatory Visit: Payer: Medicare Other | Attending: Internal Medicine

## 2019-03-21 DIAGNOSIS — Z23 Encounter for immunization: Secondary | ICD-10-CM | POA: Insufficient documentation

## 2019-03-21 NOTE — Progress Notes (Signed)
   Covid-19 Vaccination Clinic  Name:  [name]    MRN: [id number] DOB: 12-21-1930  03/21/2019  [name] was observed post Covid-19 immunization for 15 minutes without incidence. She was provided with Vaccine Information Sheet and instruction to access the V-Safe system.   [name] was instructed to call 911 with any severe reactions post vaccine: Marland Kitchen Difficulty breathing  . Swelling of your face and throat  . A fast heartbeat  . A bad rash all over your body  . Dizziness and weakness    Immunizations Administered    Name Date Dose VIS Date Route   Pfizer COVID-19 Vaccine 03/21/2019  8:51 AM 0.3 mL 01/27/2019 Intramuscular   Manufacturer: [employer], [employer]   Lot: [id number]   NDC: [address]-1

## 2019-03-22 DIAGNOSIS — R7301 Impaired fasting glucose: Secondary | ICD-10-CM | POA: Diagnosis not present

## 2019-03-22 DIAGNOSIS — I129 Hypertensive chronic kidney disease with stage 1 through stage 4 chronic kidney disease, or unspecified chronic kidney disease: Secondary | ICD-10-CM | POA: Diagnosis not present

## 2019-03-22 DIAGNOSIS — N1831 Chronic kidney disease, stage 3a: Secondary | ICD-10-CM | POA: Diagnosis not present

## 2019-03-22 DIAGNOSIS — Z0000 Encounter for general adult medical examination without abnormal findings: Secondary | ICD-10-CM | POA: Diagnosis not present

## 2019-03-28 DIAGNOSIS — H401131 Primary open-angle glaucoma, bilateral, mild stage: Secondary | ICD-10-CM | POA: Diagnosis not present

## 2019-05-03 DIAGNOSIS — M810 Age-related osteoporosis without current pathological fracture: Secondary | ICD-10-CM | POA: Diagnosis not present

## 2019-07-18 DIAGNOSIS — H401131 Primary open-angle glaucoma, bilateral, mild stage: Secondary | ICD-10-CM | POA: Diagnosis not present

## 2019-11-08 DIAGNOSIS — Z23 Encounter for immunization: Secondary | ICD-10-CM | POA: Diagnosis not present

## 2019-11-08 DIAGNOSIS — M810 Age-related osteoporosis without current pathological fracture: Secondary | ICD-10-CM | POA: Diagnosis not present

## 2019-11-17 DIAGNOSIS — H5213 Myopia, bilateral: Secondary | ICD-10-CM | POA: Diagnosis not present

## 2019-12-27 DIAGNOSIS — B308 Other viral conjunctivitis: Secondary | ICD-10-CM | POA: Diagnosis not present

## 2019-12-27 DIAGNOSIS — H10023 Other mucopurulent conjunctivitis, bilateral: Secondary | ICD-10-CM | POA: Diagnosis not present

## 2020-03-28 DIAGNOSIS — I129 Hypertensive chronic kidney disease with stage 1 through stage 4 chronic kidney disease, or unspecified chronic kidney disease: Secondary | ICD-10-CM | POA: Diagnosis not present

## 2020-03-28 DIAGNOSIS — Z0000 Encounter for general adult medical examination without abnormal findings: Secondary | ICD-10-CM | POA: Diagnosis not present

## 2020-03-28 DIAGNOSIS — F4321 Adjustment disorder with depressed mood: Secondary | ICD-10-CM | POA: Diagnosis not present

## 2020-03-28 DIAGNOSIS — R7301 Impaired fasting glucose: Secondary | ICD-10-CM | POA: Diagnosis not present

## 2020-03-28 DIAGNOSIS — N183 Chronic kidney disease, stage 3 unspecified: Secondary | ICD-10-CM | POA: Diagnosis not present

## 2020-04-22 ENCOUNTER — Other Ambulatory Visit: Payer: Self-pay | Admitting: Family Medicine

## 2020-04-22 ENCOUNTER — Ambulatory Visit
Admission: RE | Admit: 2020-04-22 | Discharge: 2020-04-22 | Disposition: A | Discharge status: Home / Self Care | Payer: Medicare Other | Source: Ambulatory Visit | Attending: Family Medicine | Admitting: Family Medicine

## 2020-04-22 DIAGNOSIS — R079 Chest pain, unspecified: Secondary | ICD-10-CM

## 2020-04-22 DIAGNOSIS — B351 Tinea unguium: Secondary | ICD-10-CM | POA: Diagnosis not present

## 2020-04-22 DIAGNOSIS — M25519 Pain in unspecified shoulder: Secondary | ICD-10-CM | POA: Diagnosis not present

## 2020-04-22 DIAGNOSIS — N644 Mastodynia: Secondary | ICD-10-CM | POA: Diagnosis not present

## 2020-04-23 DIAGNOSIS — M25512 Pain in left shoulder: Secondary | ICD-10-CM | POA: Diagnosis not present

## 2020-04-24 ENCOUNTER — Other Ambulatory Visit: Payer: Self-pay | Admitting: Family Medicine

## 2020-04-24 DIAGNOSIS — R9389 Abnormal findings on diagnostic imaging of other specified body structures: Secondary | ICD-10-CM

## 2020-04-29 ENCOUNTER — Other Ambulatory Visit: Payer: Self-pay

## 2020-04-29 ENCOUNTER — Ambulatory Visit: Payer: Medicare Other | Admitting: Podiatry

## 2020-04-29 DIAGNOSIS — M79674 Pain in right toe(s): Secondary | ICD-10-CM

## 2020-04-29 DIAGNOSIS — M79675 Pain in left toe(s): Secondary | ICD-10-CM | POA: Diagnosis not present

## 2020-04-29 DIAGNOSIS — B351 Tinea unguium: Secondary | ICD-10-CM

## 2020-05-09 ENCOUNTER — Ambulatory Visit
Admission: RE | Admit: 2020-05-09 | Discharge: 2020-05-09 | Disposition: A | Discharge status: Home / Self Care | Payer: Medicare Other | Source: Ambulatory Visit | Attending: Family Medicine | Admitting: Family Medicine

## 2020-05-09 ENCOUNTER — Inpatient Hospital Stay: Admission: RE | Admit: 2020-05-09 | Payer: Medicare Other | Source: Ambulatory Visit

## 2020-05-09 DIAGNOSIS — R918 Other nonspecific abnormal finding of lung field: Secondary | ICD-10-CM | POA: Diagnosis not present

## 2020-05-09 DIAGNOSIS — R9389 Abnormal findings on diagnostic imaging of other specified body structures: Secondary | ICD-10-CM

## 2020-05-09 DIAGNOSIS — M810 Age-related osteoporosis without current pathological fracture: Secondary | ICD-10-CM | POA: Diagnosis not present

## 2020-05-19 NOTE — Progress Notes (Signed)
   SUBJECTIVE Patient presents to office today complaining of elongated, thickened nails that cause pain while ambulating in shoes.  She is unable to trim her own nails. Patient is here for further evaluation and treatment.  No past medical history on file.  OBJECTIVE General Patient is awake, alert, and oriented x 3 and in no acute distress. Derm Skin is dry and supple bilateral. Negative open lesions or macerations. Remaining integument unremarkable. Nails are tender, long, thickened and dystrophic with subungual debris, consistent with onychomycosis, 1-5 bilateral. No signs of infection noted. Vasc  DP and PT pedal pulses palpable bilaterally. Temperature gradient within normal limits.  Neuro Epicritic and protective threshold sensation grossly intact bilaterally.  Musculoskeletal Exam No symptomatic pedal deformities noted bilateral. Muscular strength within normal limits.  ASSESSMENT 1. Onychodystrophic nails 1-5 bilateral with hyperkeratosis of nails.  2. Onychomycosis of nail due to dermatophyte bilateral 3. Pain in foot bilateral  PLAN OF CARE 1. Patient evaluated today.  2. Instructed to maintain good pedal hygiene and foot care.  3. Mechanical debridement of nails 1-5 bilaterally performed using a nail nipper. Filed with dremel without incident.  4. Return to clinic in 3 mos.    Felecia Shelling, DPM [facility]  Dr. Felecia Shelling, DPM    [address]                                        [place], [place] [address]                Office [phone]  Fax [phone]

## 2020-05-22 DIAGNOSIS — R928 Other abnormal and inconclusive findings on diagnostic imaging of breast: Secondary | ICD-10-CM | POA: Diagnosis not present

## 2020-05-22 DIAGNOSIS — N644 Mastodynia: Secondary | ICD-10-CM | POA: Diagnosis not present

## 2020-06-06 DIAGNOSIS — H40013 Open angle with borderline findings, low risk, bilateral: Secondary | ICD-10-CM | POA: Diagnosis not present

## 2020-07-31 ENCOUNTER — Other Ambulatory Visit: Payer: Self-pay

## 2020-07-31 ENCOUNTER — Ambulatory Visit: Payer: Medicare Other | Admitting: Podiatry

## 2020-07-31 ENCOUNTER — Encounter: Payer: Self-pay | Admitting: Podiatry

## 2020-07-31 DIAGNOSIS — B351 Tinea unguium: Secondary | ICD-10-CM

## 2020-07-31 DIAGNOSIS — M810 Age-related osteoporosis without current pathological fracture: Secondary | ICD-10-CM | POA: Insufficient documentation

## 2020-07-31 DIAGNOSIS — R7301 Impaired fasting glucose: Secondary | ICD-10-CM | POA: Insufficient documentation

## 2020-07-31 DIAGNOSIS — I129 Hypertensive chronic kidney disease with stage 1 through stage 4 chronic kidney disease, or unspecified chronic kidney disease: Secondary | ICD-10-CM | POA: Insufficient documentation

## 2020-07-31 DIAGNOSIS — M79675 Pain in left toe(s): Secondary | ICD-10-CM | POA: Diagnosis not present

## 2020-07-31 DIAGNOSIS — R9389 Abnormal findings on diagnostic imaging of other specified body structures: Secondary | ICD-10-CM | POA: Insufficient documentation

## 2020-07-31 DIAGNOSIS — M79674 Pain in right toe(s): Secondary | ICD-10-CM

## 2020-07-31 DIAGNOSIS — E782 Mixed hyperlipidemia: Secondary | ICD-10-CM | POA: Insufficient documentation

## 2020-07-31 DIAGNOSIS — M109 Gout, unspecified: Secondary | ICD-10-CM | POA: Insufficient documentation

## 2020-07-31 DIAGNOSIS — F419 Anxiety disorder, unspecified: Secondary | ICD-10-CM | POA: Insufficient documentation

## 2020-07-31 DIAGNOSIS — F4321 Adjustment disorder with depressed mood: Secondary | ICD-10-CM | POA: Insufficient documentation

## 2020-07-31 DIAGNOSIS — E039 Hypothyroidism, unspecified: Secondary | ICD-10-CM | POA: Insufficient documentation

## 2020-07-31 DIAGNOSIS — M1990 Unspecified osteoarthritis, unspecified site: Secondary | ICD-10-CM | POA: Insufficient documentation

## 2020-07-31 DIAGNOSIS — K5730 Diverticulosis of large intestine without perforation or abscess without bleeding: Secondary | ICD-10-CM | POA: Insufficient documentation

## 2020-07-31 NOTE — Progress Notes (Signed)
This patient returns to my office for at risk foot care.  This patient requires this care by a professional since this patient will be at risk due to having chronic kidney disease.  This patient is unable to cut nails himself since the patient cannot reach his nails.These nails are painful walking and wearing shoes.  This patient presents for at risk foot care today.  General Appearance  Alert, conversant and in no acute stress.  Vascular  Dorsalis pedis and posterior tibial  pulses are palpable  bilaterally.  Capillary return is within normal limits  bilaterally. Temperature is within normal limits  bilaterally.  Neurologic  Senn-Weinstein monofilament wire test within normal limits  bilaterally. Muscle power within normal limits bilaterally.  Nails Thick disfigured discolored nails with subungual debris  from hallux to fifth toes bilaterally. No evidence of bacterial infection or drainage bilaterally.  Orthopedic  No limitations of motion  feet .  No crepitus or effusions noted.  No bony pathology or digital deformities noted.  HAV  B/L>  Skin  normotropic skin with no porokeratosis noted bilaterally.  No signs of infections or ulcers noted.     Onychomycosis  Pain in right toes  Pain in left toes  Consent was obtained for treatment procedures.   Mechanical debridement of nails 1-5  bilaterally performed with a nail nipper.  Filed with dremel without incident.    Return office visit    10 weeks                  Told patient to return for periodic foot care and evaluation due to potential at risk complications.   Helane Gunther DPM

## 2020-08-15 DIAGNOSIS — R300 Dysuria: Secondary | ICD-10-CM | POA: Diagnosis not present

## 2020-08-15 DIAGNOSIS — R531 Weakness: Secondary | ICD-10-CM | POA: Diagnosis not present

## 2020-08-15 DIAGNOSIS — F418 Other specified anxiety disorders: Secondary | ICD-10-CM | POA: Diagnosis not present

## 2020-08-15 DIAGNOSIS — R195 Other fecal abnormalities: Secondary | ICD-10-CM | POA: Diagnosis not present

## 2020-09-03 ENCOUNTER — Encounter: Payer: Self-pay | Admitting: Podiatry

## 2020-09-03 ENCOUNTER — Telehealth: Payer: Self-pay | Admitting: Podiatry

## 2020-09-03 NOTE — Telephone Encounter (Signed)
Called patient lvm to reschedule 8/24 appt and sent reschedule letter  

## 2020-10-09 ENCOUNTER — Ambulatory Visit: Payer: Medicare Other | Admitting: Podiatry

## 2020-10-11 DIAGNOSIS — E782 Mixed hyperlipidemia: Secondary | ICD-10-CM | POA: Diagnosis not present

## 2020-10-11 DIAGNOSIS — N183 Chronic kidney disease, stage 3 unspecified: Secondary | ICD-10-CM | POA: Diagnosis not present

## 2020-10-11 DIAGNOSIS — R7301 Impaired fasting glucose: Secondary | ICD-10-CM | POA: Diagnosis not present

## 2020-10-11 DIAGNOSIS — I129 Hypertensive chronic kidney disease with stage 1 through stage 4 chronic kidney disease, or unspecified chronic kidney disease: Secondary | ICD-10-CM | POA: Diagnosis not present

## 2020-10-16 DIAGNOSIS — H524 Presbyopia: Secondary | ICD-10-CM | POA: Diagnosis not present

## 2020-11-01 ENCOUNTER — Ambulatory Visit: Payer: Medicare Other | Admitting: Podiatry

## 2020-11-04 ENCOUNTER — Encounter: Payer: Self-pay | Admitting: Podiatry

## 2020-11-04 ENCOUNTER — Other Ambulatory Visit: Payer: Self-pay

## 2020-11-04 ENCOUNTER — Ambulatory Visit: Payer: Medicare Other | Admitting: Podiatry

## 2020-11-04 DIAGNOSIS — M79675 Pain in left toe(s): Secondary | ICD-10-CM

## 2020-11-04 DIAGNOSIS — B351 Tinea unguium: Secondary | ICD-10-CM | POA: Diagnosis not present

## 2020-11-04 DIAGNOSIS — M79674 Pain in right toe(s): Secondary | ICD-10-CM | POA: Diagnosis not present

## 2020-11-04 DIAGNOSIS — I129 Hypertensive chronic kidney disease with stage 1 through stage 4 chronic kidney disease, or unspecified chronic kidney disease: Secondary | ICD-10-CM | POA: Diagnosis not present

## 2020-11-04 NOTE — Progress Notes (Signed)
This patient returns to my office for at risk foot care.  This patient requires this care by a professional since this patient will be at risk due to having chronic kidney disease.  This patient is unable to cut nails himself since the patient cannot reach his nails.These nails are painful walking and wearing shoes.  This patient presents for at risk foot care today.  General Appearance  Alert, conversant and in no acute stress.  Vascular  Dorsalis pedis and posterior tibial  pulses are palpable  bilaterally.  Capillary return is within normal limits  bilaterally. Temperature is within normal limits  bilaterally.  Neurologic  Senn-Weinstein monofilament wire test within normal limits  bilaterally. Muscle power within normal limits bilaterally.  Nails Thick disfigured discolored nails with subungual debris  from hallux to fifth toes bilaterally. No evidence of bacterial infection or drainage bilaterally.  Orthopedic  No limitations of motion  feet .  No crepitus or effusions noted.  No bony pathology or digital deformities noted.  HAV  B/L.  Skin  normotropic skin with no porokeratosis noted bilaterally.  No signs of infections or ulcers noted.     Onychomycosis  Pain in right toes  Pain in left toes  Consent was obtained for treatment procedures.   Mechanical debridement of nails 1-5  bilaterally performed with a nail nipper.  Filed with dremel without incident.    Return office visit    12 weeks                  Told patient to return for periodic foot care and evaluation due to potential at risk complications.   Vallen Calabrese DPM   

## 2020-11-11 DIAGNOSIS — M810 Age-related osteoporosis without current pathological fracture: Secondary | ICD-10-CM | POA: Diagnosis not present

## 2020-11-11 DIAGNOSIS — Z23 Encounter for immunization: Secondary | ICD-10-CM | POA: Diagnosis not present

## 2021-02-12 ENCOUNTER — Other Ambulatory Visit: Payer: Self-pay

## 2021-02-12 ENCOUNTER — Encounter: Payer: Self-pay | Admitting: Podiatry

## 2021-02-12 ENCOUNTER — Ambulatory Visit: Payer: Medicare Other | Admitting: Podiatry

## 2021-02-12 DIAGNOSIS — I129 Hypertensive chronic kidney disease with stage 1 through stage 4 chronic kidney disease, or unspecified chronic kidney disease: Secondary | ICD-10-CM

## 2021-02-12 DIAGNOSIS — B351 Tinea unguium: Secondary | ICD-10-CM | POA: Diagnosis not present

## 2021-02-12 DIAGNOSIS — M79674 Pain in right toe(s): Secondary | ICD-10-CM | POA: Diagnosis not present

## 2021-02-12 DIAGNOSIS — M79675 Pain in left toe(s): Secondary | ICD-10-CM

## 2021-02-12 NOTE — Progress Notes (Signed)
This patient returns to my office for at risk foot care.  This patient requires this care by a professional since this patient will be at risk due to having chronic kidney disease.  This patient is unable to cut nails himself since the patient cannot reach his nails.These nails are painful walking and wearing shoes.  This patient presents for at risk foot care today.  General Appearance  Alert, conversant and in no acute stress.  Vascular  Dorsalis pedis and posterior tibial  pulses are palpable  bilaterally.  Capillary return is within normal limits  bilaterally. Temperature is within normal limits  bilaterally.  Neurologic  Senn-Weinstein monofilament wire test within normal limits  bilaterally. Muscle power within normal limits bilaterally.  Nails Thick disfigured discolored nails with subungual debris  from hallux to fifth toes bilaterally. No evidence of bacterial infection or drainage bilaterally.  Orthopedic  No limitations of motion  feet .  No crepitus or effusions noted.  No bony pathology or digital deformities noted.  HAV  B/L.  Skin  normotropic skin with no porokeratosis noted bilaterally.  No signs of infections or ulcers noted.     Onychomycosis  Pain in right toes  Pain in left toes  Consent was obtained for treatment procedures.   Mechanical debridement of nails 1-5  bilaterally performed with a nail nipper.  Filed with dremel without incident.    Return office visit    12 weeks                  Told patient to return for periodic foot care and evaluation due to potential at risk complications.   Emeri Estill DPM   

## 2021-02-18 DIAGNOSIS — J069 Acute upper respiratory infection, unspecified: Secondary | ICD-10-CM | POA: Diagnosis not present

## 2021-04-09 DIAGNOSIS — Z0000 Encounter for general adult medical examination without abnormal findings: Secondary | ICD-10-CM | POA: Diagnosis not present

## 2021-04-09 DIAGNOSIS — R7301 Impaired fasting glucose: Secondary | ICD-10-CM | POA: Diagnosis not present

## 2021-04-09 DIAGNOSIS — E039 Hypothyroidism, unspecified: Secondary | ICD-10-CM | POA: Diagnosis not present

## 2021-04-09 DIAGNOSIS — F411 Generalized anxiety disorder: Secondary | ICD-10-CM | POA: Diagnosis not present

## 2021-04-09 DIAGNOSIS — I129 Hypertensive chronic kidney disease with stage 1 through stage 4 chronic kidney disease, or unspecified chronic kidney disease: Secondary | ICD-10-CM | POA: Diagnosis not present

## 2021-04-09 DIAGNOSIS — E782 Mixed hyperlipidemia: Secondary | ICD-10-CM | POA: Diagnosis not present

## 2021-05-12 DIAGNOSIS — M810 Age-related osteoporosis without current pathological fracture: Secondary | ICD-10-CM | POA: Diagnosis not present

## 2021-05-16 ENCOUNTER — Ambulatory Visit: Payer: Medicare Other | Admitting: Podiatry

## 2021-06-04 ENCOUNTER — Ambulatory Visit: Payer: Medicare Other | Admitting: Podiatry

## 2021-06-11 ENCOUNTER — Ambulatory Visit: Payer: Medicare Other | Admitting: Podiatry

## 2021-06-11 ENCOUNTER — Encounter: Payer: Self-pay | Admitting: Podiatry

## 2021-06-11 DIAGNOSIS — M79674 Pain in right toe(s): Secondary | ICD-10-CM

## 2021-06-11 DIAGNOSIS — B351 Tinea unguium: Secondary | ICD-10-CM | POA: Diagnosis not present

## 2021-06-11 DIAGNOSIS — M79675 Pain in left toe(s): Secondary | ICD-10-CM

## 2021-06-11 DIAGNOSIS — I129 Hypertensive chronic kidney disease with stage 1 through stage 4 chronic kidney disease, or unspecified chronic kidney disease: Secondary | ICD-10-CM

## 2021-06-11 NOTE — Progress Notes (Signed)
This patient returns to my office for at risk foot care.  This patient requires this care by a professional since this patient will be at risk due to having chronic kidney disease.  This patient is unable to cut nails himself since the patient cannot reach his nails.These nails are painful walking and wearing shoes.  This patient presents for at risk foot care today.  General Appearance  Alert, conversant and in no acute stress.  Vascular  Dorsalis pedis and posterior tibial  pulses are palpable  bilaterally.  Capillary return is within normal limits  bilaterally. Temperature is within normal limits  bilaterally.  Neurologic  Senn-Weinstein monofilament wire test within normal limits  bilaterally. Muscle power within normal limits bilaterally.  Nails Thick disfigured discolored nails with subungual debris  from hallux to fifth toes bilaterally. No evidence of bacterial infection or drainage bilaterally.  Orthopedic  No limitations of motion  feet .  No crepitus or effusions noted.  No bony pathology or digital deformities noted.  HAV  B/L.  Skin  normotropic skin with no porokeratosis noted bilaterally.  No signs of infections or ulcers noted.     Onychomycosis  Pain in right toes  Pain in left toes  Consent was obtained for treatment procedures.   Mechanical debridement of nails 1-5  bilaterally performed with a nail nipper.  Filed with dremel without incident.    Return office visit    12 weeks                  Told patient to return for periodic foot care and evaluation due to potential at risk complications.   Janvi Ammar DPM   

## 2021-07-31 DIAGNOSIS — H40013 Open angle with borderline findings, low risk, bilateral: Secondary | ICD-10-CM | POA: Diagnosis not present

## 2021-09-05 ENCOUNTER — Ambulatory Visit: Payer: Medicare Other | Admitting: Podiatry

## 2021-10-01 ENCOUNTER — Encounter: Payer: Self-pay | Admitting: Podiatry

## 2021-10-01 ENCOUNTER — Ambulatory Visit: Payer: Medicare Other | Admitting: Podiatry

## 2021-10-01 DIAGNOSIS — M79674 Pain in right toe(s): Secondary | ICD-10-CM | POA: Diagnosis not present

## 2021-10-01 DIAGNOSIS — M79675 Pain in left toe(s): Secondary | ICD-10-CM

## 2021-10-01 DIAGNOSIS — I129 Hypertensive chronic kidney disease with stage 1 through stage 4 chronic kidney disease, or unspecified chronic kidney disease: Secondary | ICD-10-CM

## 2021-10-01 DIAGNOSIS — B351 Tinea unguium: Secondary | ICD-10-CM | POA: Diagnosis not present

## 2021-10-01 NOTE — Progress Notes (Signed)
This patient returns to my office for at risk foot care.  This patient requires this care by a professional since this patient will be at risk due to having chronic kidney disease.  This patient is unable to cut nails himself since the patient cannot reach his nails.These nails are painful walking and wearing shoes.  This patient presents for at risk foot care today.  General Appearance  Alert, conversant and in no acute stress.  Vascular  Dorsalis pedis and posterior tibial  pulses are palpable  bilaterally.  Capillary return is within normal limits  bilaterally. Temperature is within normal limits  bilaterally.  Neurologic  Senn-Weinstein monofilament wire test within normal limits  bilaterally. Muscle power within normal limits bilaterally.  Nails Thick disfigured discolored nails with subungual debris  from hallux to fifth toes bilaterally. No evidence of bacterial infection or drainage bilaterally.  Orthopedic  No limitations of motion  feet .  No crepitus or effusions noted.  No bony pathology or digital deformities noted.  HAV  B/L.  Skin  normotropic skin with no porokeratosis noted bilaterally.  No signs of infections or ulcers noted.     Onychomycosis  Pain in right toes  Pain in left toes  Consent was obtained for treatment procedures.   Mechanical debridement of nails 1-5  bilaterally performed with a nail nipper.  Filed with dremel without incident.    Return office visit    12 weeks                  Told patient to return for periodic foot care and evaluation due to potential at risk complications.   Madolyn Ackroyd DPM   

## 2021-10-08 DIAGNOSIS — R7301 Impaired fasting glucose: Secondary | ICD-10-CM | POA: Diagnosis not present

## 2021-10-08 DIAGNOSIS — N183 Chronic kidney disease, stage 3 unspecified: Secondary | ICD-10-CM | POA: Diagnosis not present

## 2021-10-08 DIAGNOSIS — E039 Hypothyroidism, unspecified: Secondary | ICD-10-CM | POA: Diagnosis not present

## 2021-10-08 DIAGNOSIS — E782 Mixed hyperlipidemia: Secondary | ICD-10-CM | POA: Diagnosis not present

## 2021-10-08 DIAGNOSIS — I129 Hypertensive chronic kidney disease with stage 1 through stage 4 chronic kidney disease, or unspecified chronic kidney disease: Secondary | ICD-10-CM | POA: Diagnosis not present

## 2021-10-28 DIAGNOSIS — M62830 Muscle spasm of back: Secondary | ICD-10-CM | POA: Diagnosis not present

## 2021-11-06 IMAGING — CT CT CHEST HIGH RESOLUTION W/O CM
2 of 7 series · 14 of 36 positions shown, 17 images · non-contrast
Comparison: No prior chest CT.

CLINICAL DATA: 89-year-old female with history of bibasilar
opacities noted on chest x-ray. Follow-up study.

EXAM:
CT CHEST WITHOUT CONTRAST
TECHNIQUE: Multidetector CT imaging of the chest was performed following the
standard protocol without intravenous contrast. High resolution
imaging of the lungs, as well as inspiratory and expiratory imaging,
was performed.

[Series 4: chest 2.00 br36 s3 cor soft · coronal · 0.58mm/px · 3 of 180 slices shown]
[im 36/180  lung]
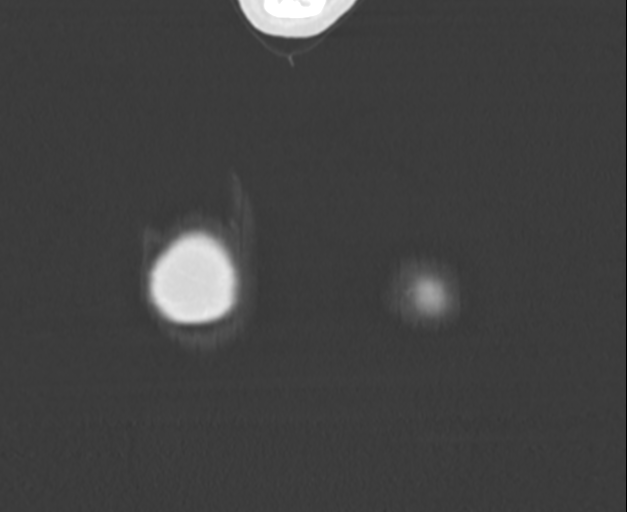
[im 72/180  lung]
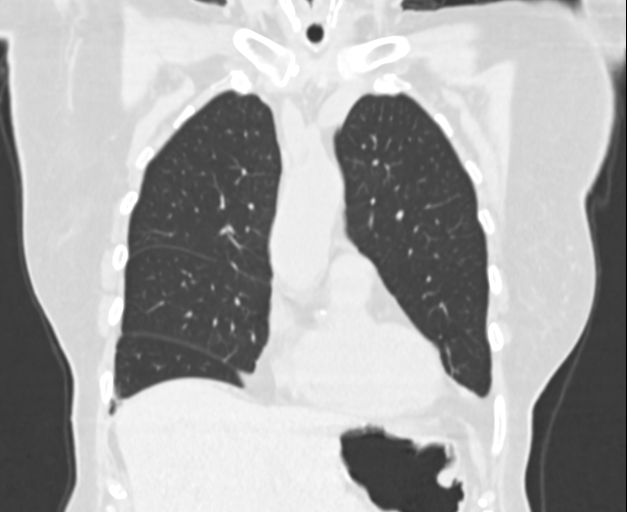
[im 108/180  lung]
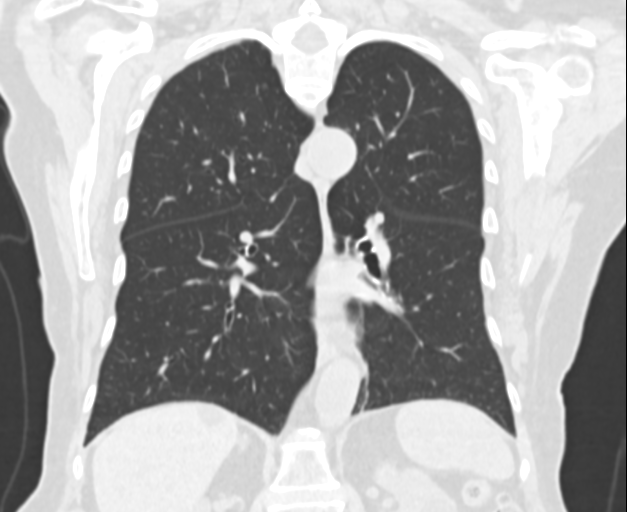

[Series 11: chest 1.00 br60 s3 high res thins 1x1 mm · axial · 0.71mm/px · z∈[+1700,+1950]mm · 11 of 296 slices shown, 14 images]
[im 23/296  mediastinal]
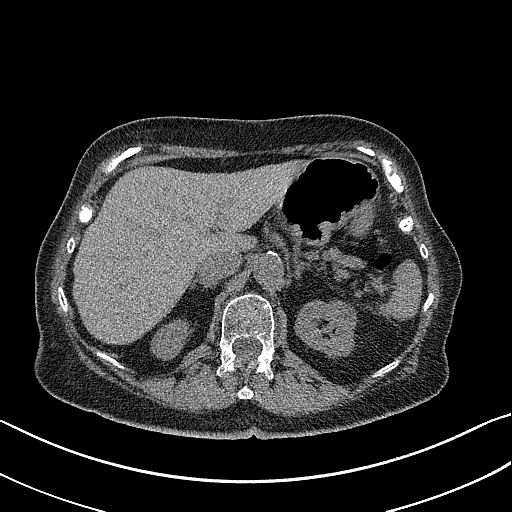
[im 23/296  lung]
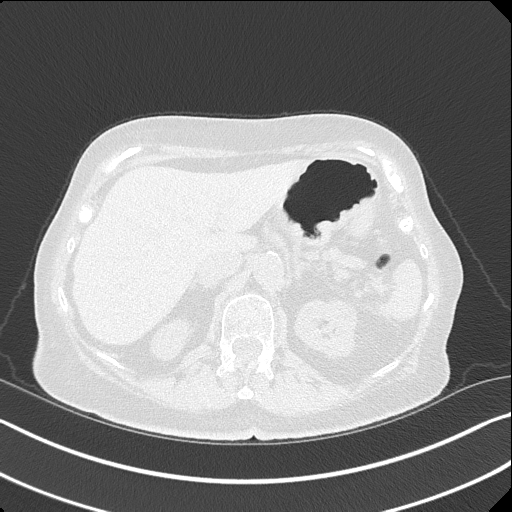
[im 46/296  lung]
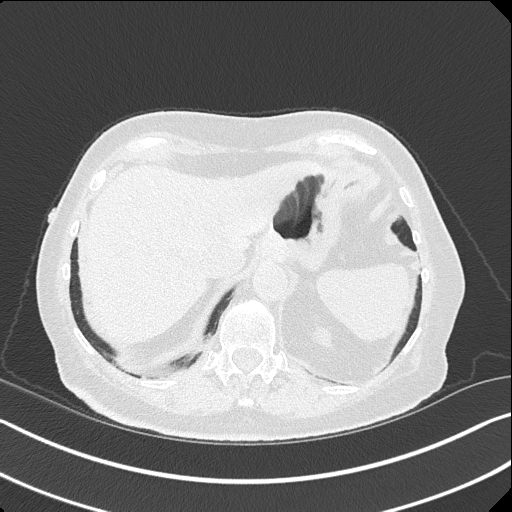
[im 69/296  lung]
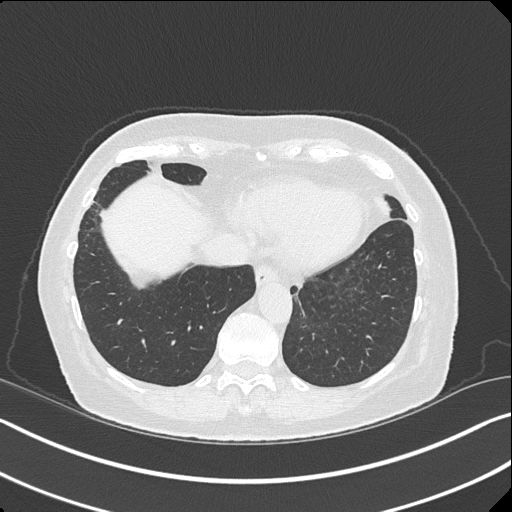
[im 91/296  lung]
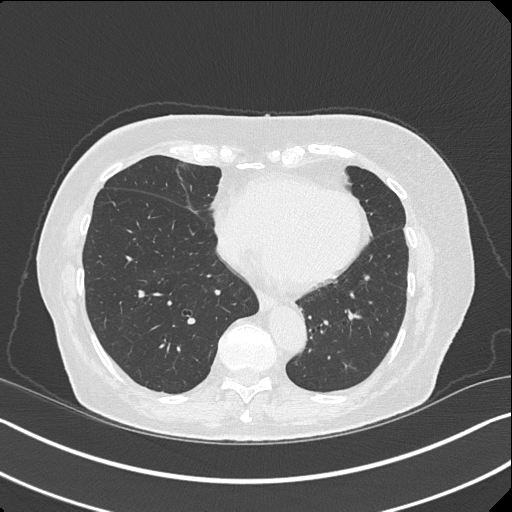
[im 114/296  mediastinal]
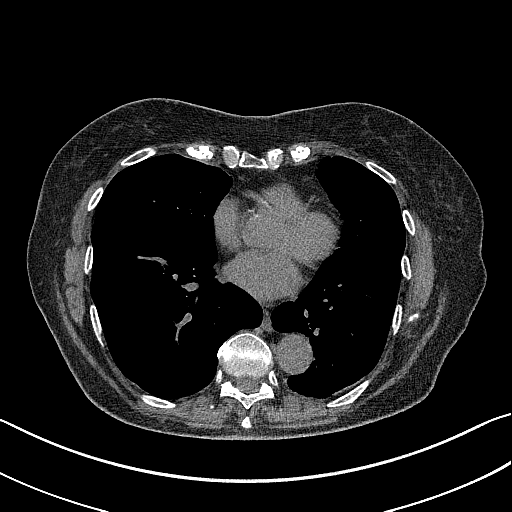
[im 114/296  lung]
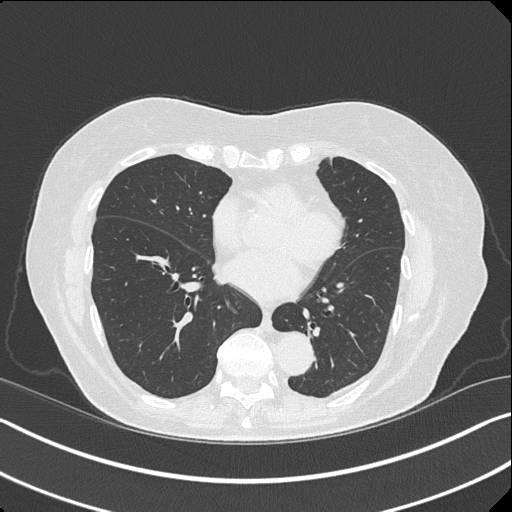
[im 159/296  lung]
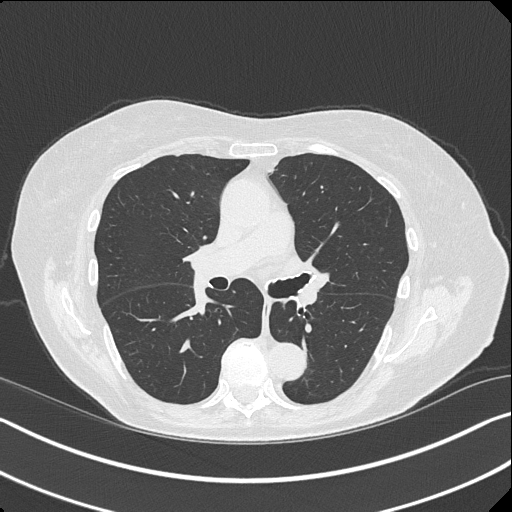
[im 182/296  lung]
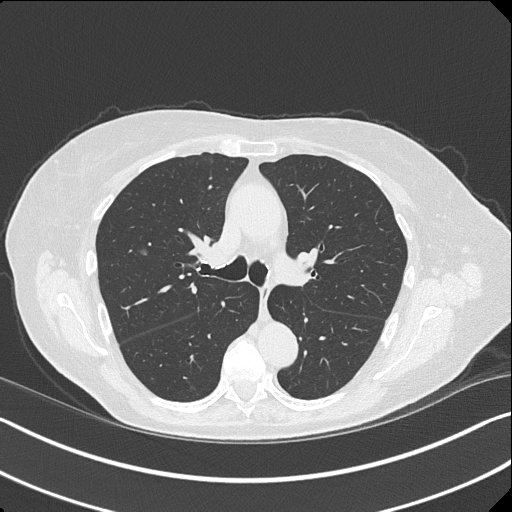
[im 205/296  lung]
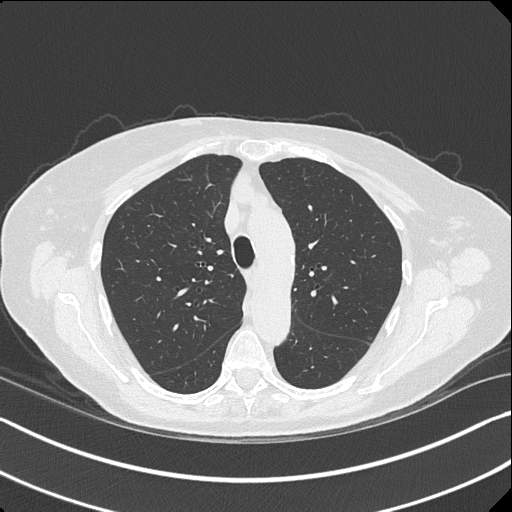
[im 227/296  mediastinal]
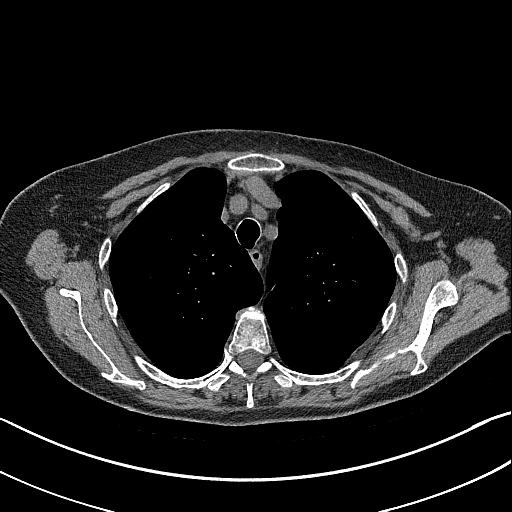
[im 227/296  lung]
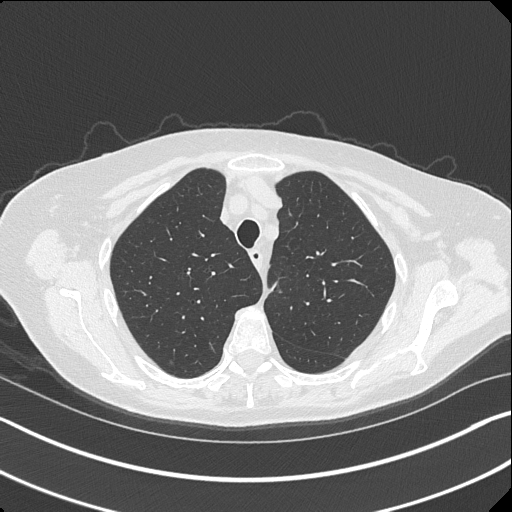
[im 250/296  lung]
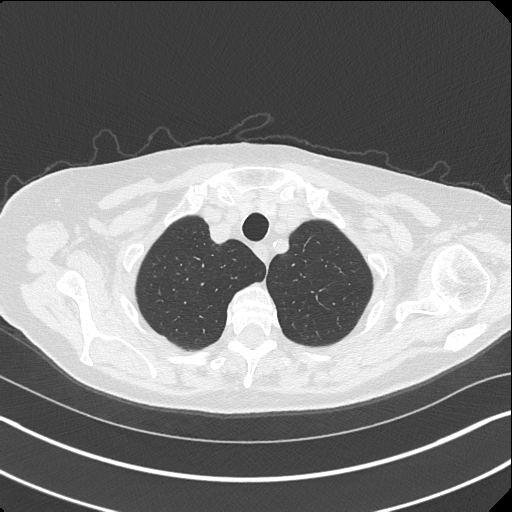
[im 273/296  lung]
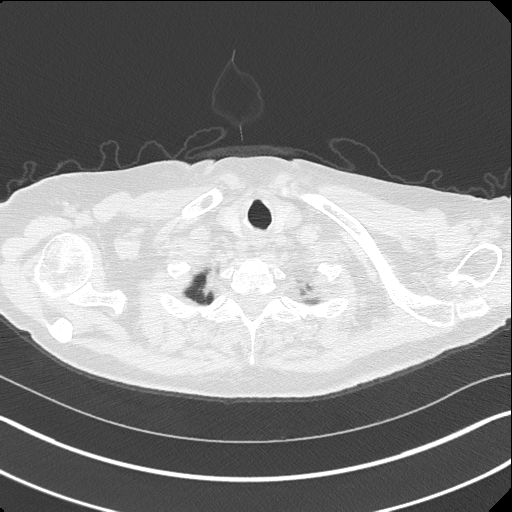

[14 of 36 positions shown; findings below may reference images not displayed]

FINDINGS: Cardiovascular: Heart size is normal. There is no significant
pericardial fluid, thickening or pericardial calcification. There is
aortic atherosclerosis, as well as atherosclerosis of the great
vessels of the mediastinum and the coronary arteries, including
calcified atherosclerotic plaque in the left main, left anterior
descending and right coronary arteries.

Mediastinum/Nodes: No pathologically enlarged mediastinal or hilar
lymph nodes. Please note that accurate exclusion of hilar adenopathy
is limited on noncontrast CT scans. Esophagus is unremarkable in
appearance. No axillary lymphadenopathy.

Lungs/Pleura: Multiple tiny pulmonary nodules are noted in the lungs
bilaterally measuring 4 mm or less in size. No larger more
suspicious appearing pulmonary nodules or masses are noted. No acute
consolidative airspace disease. No pleural effusions.
High-resolution images demonstrate minimal ground-glass attenuation
in the base of the right lower lobe, favored to be atelectatic. No
other generalized regions of ground-glass attenuation, septal
thickening, subpleural reticulation, traction bronchiectasis or
honeycombing noted to suggest interstitial lung disease. Inspiratory
and expiratory imaging demonstrates some mild air trapping
indicative of mild small airways disease.

Upper Abdomen: Aortic atherosclerosis. Multiple small
low-attenuation lesions are noted throughout the liver, incompletely
characterized on today's non-contrast CT examination, but
statistically likely to represent cysts. In the left kidney there
are also incompletely imaged lesions which are low-attenuation and a
high attenuation, not characterized on today's non-contrast CT
examination, but likely to represent small cysts and
proteinaceous/hemorrhagic cysts.

Musculoskeletal: There are no aggressive appearing lytic or blastic
lesions noted in the visualized portions of the skeleton.
IMPRESSION: 1. No findings to suggest interstitial lung disease.
2. Multiple small pulmonary nodules measuring 4 mm or less in size,
nonspecific, but statistically likely benign. No follow-up needed if
patient is low-risk (and has no known or suspected primary
neoplasm). Non-contrast chest CT can be considered in 12 months if
patient is high-risk. This recommendation follows the consensus
statement: Guidelines for Management of Incidental Pulmonary Nodules
Detected on CT Images: From the [HOSPITAL] 1957; Radiology
1955; [DATE].
3. Aortic atherosclerosis, in addition to left main and 2 vessel
coronary artery disease.
4. Additional incidental findings, as above.

Aortic Atherosclerosis ([id number]).

## 2021-11-14 DIAGNOSIS — M810 Age-related osteoporosis without current pathological fracture: Secondary | ICD-10-CM | POA: Diagnosis not present

## 2021-11-14 DIAGNOSIS — Z23 Encounter for immunization: Secondary | ICD-10-CM | POA: Diagnosis not present

## 2021-12-05 DIAGNOSIS — R300 Dysuria: Secondary | ICD-10-CM | POA: Diagnosis not present

## 2021-12-08 ENCOUNTER — Other Ambulatory Visit: Payer: Self-pay

## 2021-12-08 ENCOUNTER — Encounter (HOSPITAL_COMMUNITY): Payer: Self-pay

## 2021-12-08 ENCOUNTER — Emergency Department (HOSPITAL_COMMUNITY): Payer: Medicare Other

## 2021-12-08 ENCOUNTER — Emergency Department (HOSPITAL_COMMUNITY)
Admission: EM | Admit: 2021-12-08 | Discharge: 2021-12-08 | Disposition: A | Discharge status: Home / Self Care | Payer: Medicare Other | Attending: Emergency Medicine | Admitting: Emergency Medicine

## 2021-12-08 DIAGNOSIS — R300 Dysuria: Secondary | ICD-10-CM | POA: Diagnosis not present

## 2021-12-08 DIAGNOSIS — R1110 Vomiting, unspecified: Secondary | ICD-10-CM | POA: Diagnosis not present

## 2021-12-08 DIAGNOSIS — R9431 Abnormal electrocardiogram [ECG] [EKG]: Secondary | ICD-10-CM | POA: Diagnosis not present

## 2021-12-08 DIAGNOSIS — R1032 Left lower quadrant pain: Secondary | ICD-10-CM | POA: Insufficient documentation

## 2021-12-08 DIAGNOSIS — M5459 Other low back pain: Secondary | ICD-10-CM | POA: Diagnosis not present

## 2021-12-08 DIAGNOSIS — I10 Essential (primary) hypertension: Secondary | ICD-10-CM | POA: Diagnosis not present

## 2021-12-08 DIAGNOSIS — K5730 Diverticulosis of large intestine without perforation or abscess without bleeding: Secondary | ICD-10-CM | POA: Diagnosis not present

## 2021-12-08 DIAGNOSIS — N289 Disorder of kidney and ureter, unspecified: Secondary | ICD-10-CM | POA: Diagnosis not present

## 2021-12-08 DIAGNOSIS — M545 Low back pain, unspecified: Secondary | ICD-10-CM | POA: Diagnosis not present

## 2021-12-08 HISTORY — DX: Pure hypercholesterolemia, unspecified: E78.00

## 2021-12-08 HISTORY — DX: Disorder of thyroid, unspecified: E07.9

## 2021-12-08 HISTORY — DX: Essential (primary) hypertension: I10

## 2021-12-08 LAB — CBC WITH DIFFERENTIAL/PLATELET
Abs Immature Granulocytes: 0.04 10*3/uL (ref 0.00–0.07)
Basophils Absolute: 0 10*3/uL (ref 0.0–0.1)
Basophils Relative: 0 %
Eosinophils Absolute: 0 10*3/uL (ref 0.0–0.5)
Eosinophils Relative: 0 %
HCT: 36.9 % (ref 36.0–46.0)
Hemoglobin: 12.7 g/dL (ref 12.0–15.0)
Immature Granulocytes: 0 %
Lymphocytes Relative: 16 %
Lymphs Abs: 1.7 10*3/uL (ref 0.7–4.0)
MCH: 30.9 pg (ref 26.0–34.0)
MCHC: 34.4 g/dL (ref 30.0–36.0)
MCV: 89.8 fL (ref 80.0–100.0)
Monocytes Absolute: 0.6 10*3/uL (ref 0.1–1.0)
Monocytes Relative: 6 %
Neutro Abs: 8.1 10*3/uL — ABNORMAL HIGH (ref 1.7–7.7)
Neutrophils Relative %: 78 %
Platelets: 260 10*3/uL (ref 150–400)
RBC: 4.11 MIL/uL (ref 3.87–5.11)
RDW: 12.1 % (ref 11.5–15.5)
WBC: 10.4 10*3/uL (ref 4.0–10.5)
nRBC: 0 % (ref 0.0–0.2)

## 2021-12-08 LAB — COMPREHENSIVE METABOLIC PANEL
ALT: 16 U/L (ref 0–44)
AST: 13 U/L — ABNORMAL LOW (ref 15–41)
Albumin: 3.9 g/dL (ref 3.5–5.0)
Alkaline Phosphatase: 53 U/L (ref 38–126)
Anion gap: 11 (ref 5–15)
BUN: 15 mg/dL (ref 8–23)
CO2: 20 mmol/L — ABNORMAL LOW (ref 22–32)
Calcium: 9 mg/dL (ref 8.9–10.3)
Chloride: 98 mmol/L (ref 98–111)
Creatinine, Ser: 0.94 mg/dL (ref 0.44–1.00)
GFR, Estimated: 58 mL/min — ABNORMAL LOW (ref >60)
Glucose, Bld: 130 mg/dL — ABNORMAL HIGH (ref 70–99)
Potassium: 3.2 mmol/L — ABNORMAL LOW (ref 3.5–5.1)
Sodium: 129 mmol/L — ABNORMAL LOW (ref 135–145)
Total Bilirubin: 1 mg/dL (ref 0.3–1.2)
Total Protein: 7 g/dL (ref 6.5–8.1)

## 2021-12-08 LAB — URINALYSIS, ROUTINE W REFLEX MICROSCOPIC
Bilirubin Urine: NEGATIVE
Glucose, UA: NEGATIVE mg/dL
Hgb urine dipstick: NEGATIVE
Ketones, ur: NEGATIVE mg/dL
Leukocytes,Ua: NEGATIVE
Nitrite: NEGATIVE
Protein, ur: NEGATIVE mg/dL
Specific Gravity, Urine: 1.013 (ref 1.005–1.030)
pH: 6 (ref 5.0–8.0)

## 2021-12-08 LAB — LIPASE, BLOOD: Lipase: 31 U/L (ref 11–51)

## 2021-12-08 MED ORDER — HYDROCODONE-ACETAMINOPHEN 5-325 MG PO TABS: 1.0000 | ORAL_TABLET | Freq: Four times a day (QID) | ORAL | 0 refills | Status: AC | PRN

## 2021-12-08 MED ORDER — SODIUM CHLORIDE 0.9 % IV BOLUS
500.0000 mL | Freq: Once | INTRAVENOUS | Status: AC
  Administered 2021-12-08: 500 mL via INTRAVENOUS

## 2021-12-08 MED ORDER — ONDANSETRON 4 MG PO TBDP
4.0000 mg | ORAL_TABLET | Freq: Once | ORAL | Status: AC
  Administered 2021-12-08: 4 mg via ORAL
  Filled 2021-12-08: qty 1

## 2021-12-08 MED ORDER — POTASSIUM CHLORIDE CRYS ER 20 MEQ PO TBCR
40.0000 meq | EXTENDED_RELEASE_TABLET | Freq: Once | ORAL | Status: AC
  Administered 2021-12-08: 40 meq via ORAL
  Filled 2021-12-08: qty 2

## 2021-12-08 MED ORDER — MORPHINE SULFATE (PF) 2 MG/ML IV SOLN
2.0000 mg | Freq: Once | INTRAVENOUS | Status: AC
  Administered 2021-12-08: 2 mg via INTRAVENOUS
  Filled 2021-12-08: qty 1

## 2021-12-08 MED ORDER — ONDANSETRON HCL 4 MG/2ML IJ SOLN
4.0000 mg | Freq: Once | INTRAMUSCULAR | Status: AC
  Administered 2021-12-08: 4 mg via INTRAVENOUS
  Filled 2021-12-08: qty 2

## 2021-12-08 MED ORDER — ONDANSETRON 4 MG PO TBDP: 4.0000 mg | ORAL_TABLET | Freq: Three times a day (TID) | ORAL | 0 refills | Status: AC | PRN

## 2021-12-08 NOTE — ED Provider Triage Note (Signed)
Emergency Medicine Provider Triage Evaluation Note  [name] , a 88 y.o. female  was evaluated in triage.  Pt complains of back pain with concern for UTI x 1 week (burning), nausea with dry heaves. Went to PCP (Friday) who sent a urine culture but hasn't heard back yet, started on Keflex. Also feeling generally weak. No falls or injuries. Had right rib cage pain a few weeks ago, now pain is straight across lower back Review of Systems  Positive: As above Negative: As above  Physical Exam  BP 134/88   Pulse 99   Temp 98.1 F (36.7 C) (Oral)   Resp 16   Ht 5\' 4"  (1.626 m)   Wt 64.9 kg   SpO2 95%   BMI 24.55 kg/m  Gen:   Awake, no distress   Resp:  Normal effort  MSK:   Moves extremities without difficulty  Other:  Right Paraspinous tenderness  Medical Decision Making  Medically screening exam initiated at 11:42 AM.  Appropriate orders placed.  [name] was informed that the remainder of the evaluation will be completed by another provider, this initial triage assessment does not replace that evaluation, and the importance of remaining in the ED until their evaluation is complete.     Kelly Learn, PA-C 12/08/21 1143

## 2021-12-08 NOTE — ED Provider Notes (Signed)
[facility] DEPT Provider Note   CSN: [id number] Arrival date & time: 12/08/21  1020     History  Chief Complaint  Patient presents with   Back Pain   Emesis   Abdominal Pain    [name] is a 88 y.o. female.  HPI 88 year old female presents with low back pain, lower abdominal pain.  Started about a week ago.  She thinks the back is worse than the abdomen.  Right now it is about a 6-8.  She has been taking Tylenol and a muscle relaxer she had for her chest wall, not much help.  No fevers but she has had dry heaving.  She had a couple loose stools early on but no diarrhea since.  No constipation.  She does have some dysuria.  On 10/20 she went to her primary care doctor where she was told the urinalysis did not seem like a UTI but was put on Keflex.  She was called today to say that the culture was negative.  No leg weakness, numbness, or bowel/bladder incontinence. She feels dehydrated and generally weak. The back pain seems worse with certain positions/movements.  Home Medications Prior to Admission medications   Medication Sig Start Date End Date Taking? Authorizing Provider  HYDROcodone-acetaminophen (NORCO) 5-325 MG tablet Take 1 tablet by mouth every 6 (six) hours as needed for severe pain. 12/08/21  Yes Sherwood Gambler, MD  ondansetron (ZOFRAN-ODT) 4 MG disintegrating tablet Take 1 tablet (4 mg total) by mouth every 8 (eight) hours as needed for nausea or vomiting. 12/08/21  Yes Sherwood Gambler, MD  cephALEXin (KEFLEX) 500 MG capsule Take 1 capsule (500 mg total) by mouth 3 (three) times daily. 04/05/18   Rolland Porter, MD  cholecalciferol (VITAMIN D) 25 MCG (1000 UNIT) tablet 1 tablet    [provider]  denosumab (PROLIA) 60 MG/ML SOSY injection 60 mg    [provider]  diazepam (VALIUM) 2 MG tablet 1-2 tablets 11/15/14   [provider]  dorzolamide (TRUSOPT) 2 % ophthalmic solution 1 drop into affected eye     [provider]  dorzolamide-timolol (COSOPT) 22.3-6.8 MG/ML ophthalmic solution Place 1 drop into both eyes 3 (three) times daily. 02/28/18   [provider]  hydrochlorothiazide (HYDRODIURIL) 25 MG tablet Take 25 mg by mouth daily.    [provider]  levothyroxine (SYNTHROID) 50 MCG tablet 1 tablet every morning on an empty stomach    [provider]  levothyroxine (SYNTHROID, LEVOTHROID) 50 MCG tablet Take 50 mcg by mouth daily before breakfast.    [provider]  lisinopril (PRINIVIL,ZESTRIL) 20 MG tablet Take 20 mg by mouth daily.    [provider]  lisinopril (ZESTRIL) 20 MG tablet 1 tablet    [provider]  meloxicam (MOBIC) 15 MG tablet 1 tablet    [provider]  Multiple Vitamin (MULTI VITAMIN) TABS 1 tablet    [provider]  potassium chloride SA (K-DUR,KLOR-CON) 20 MEQ tablet Take 1 tablet (20 mEq total) by mouth 2 (two) times daily. 04/05/18   Rolland Porter, MD  simvastatin (ZOCOR) 10 MG tablet Take 10 mg by mouth daily.    [provider]  simvastatin (ZOCOR) 10 MG tablet 1 tablet 02/03/19   [provider]  vitamin B-12 (CYANOCOBALAMIN) 100 MCG tablet See admin instructions.    [provider]      Allergies    Bupropion, Colchicine, and Iodinated contrast media    Review of Systems  Review of Systems  Constitutional:  Negative for fever.  Gastrointestinal:  Positive for abdominal pain, nausea and vomiting. Negative for constipation.  Genitourinary:  Positive for dysuria. Negative for frequency.  Musculoskeletal:  Positive for back pain.  Neurological:  Negative for weakness and numbness.    Physical Exam Updated Vital Signs BP (!) 148/83   Pulse 89   Temp 97.7 F (36.5 C)   Resp 18   Ht 5\' 4"  (1.626 m)   Wt 64.9 kg   SpO2 95%   BMI 24.55 kg/m  Physical Exam Vitals and nursing note reviewed.  Constitutional:      General: She is not in acute  distress.    Appearance: She is well-developed. She is not ill-appearing or diaphoretic.  HENT:     Head: Normocephalic and atraumatic.  Cardiovascular:     Rate and Rhythm: Normal rate and regular rhythm.     Heart sounds: Normal heart sounds.  Pulmonary:     Effort: Pulmonary effort is normal.     Breath sounds: Normal breath sounds.  Abdominal:     Palpations: Abdomen is soft.     Tenderness: There is abdominal tenderness (mild) in the left lower quadrant. There is no right CVA tenderness or left CVA tenderness.  Musculoskeletal:     Thoracic back: No tenderness or bony tenderness.     Lumbar back: No tenderness or bony tenderness.  Skin:    General: Skin is warm and dry.  Neurological:     Mental Status: She is alert.     Comments: 5/5 strength in BLE. Grossly normal sensation     ED Results / Procedures / Treatments   Labs (all labs ordered are listed, but only abnormal results are displayed) Labs Reviewed  CBC WITH DIFFERENTIAL/PLATELET - Abnormal; Notable for the following components:      Result Value   Neutro Abs 8.1 (*)    All other components within normal limits  COMPREHENSIVE METABOLIC PANEL - Abnormal; Notable for the following components:   Sodium 129 (*)    Potassium 3.2 (*)    CO2 20 (*)    Glucose, Bld 130 (*)    AST 13 (*)    GFR, Estimated 58 (*)    All other components within normal limits  LIPASE, BLOOD  URINALYSIS, ROUTINE W REFLEX MICROSCOPIC    EKG EKG Interpretation  Date/Time:  Monday December 08 2021 11:49:33 EDT Ventricular Rate:  94 PR Interval:  176 QRS Duration: 89 QT Interval:  351 QTC Calculation: 439 R Axis:   88 Text Interpretation: Sinus rhythm Atrial premature complex Probable left atrial enlargement Low voltage, precordial leads Consider right ventricular hypertrophy nonspecific changes similar to 2020 Confirmed by Pricilla Loveless [phone]) on 12/08/2021 8:58:54 PM  Radiology CT ABDOMEN PELVIS WO CONTRAST  Result Date:  12/08/2021 CLINICAL DATA:  Left lower quadrant abdominal pain. EXAM: CT ABDOMEN AND PELVIS WITHOUT CONTRAST TECHNIQUE: Multidetector CT imaging of the abdomen and pelvis was performed following the standard protocol without IV contrast. RADIATION DOSE REDUCTION: This exam was performed according to the departmental dose-optimization program which includes automated exposure control, adjustment of the mA and/or kV according to patient size and/or use of iterative reconstruction technique. COMPARISON:  Abdominal MRI report dated 09/26/1998. The images are not available for direct comparison. FINDINGS: Evaluation of this exam is limited in the absence of intravenous contrast. Lower chest: Small pneumatocele at the right lung base. The visualized lung bases are clear. There is coronary vascular calcification. Focal area  of defect in the posterior right diaphragm with herniation of the abdominal fat. No intra-abdominal free air or free fluid. Hepatobiliary: Small liver cysts and faint subcentimeter hypodense lesions which are too small to characterize. No biliary dilatation. Cholecystectomy. Pancreas: Unremarkable. No pancreatic ductal dilatation or surrounding inflammatory changes. Spleen: Normal in size without focal abnormality. Adrenals/Urinary Tract: The adrenal glands are unremarkable. A 1.3 cm left renal angiomyolipoma as reported on the MRI. Several additional bilateral renal hypodense lesions measure up to 2 cm in the interpolar left kidney, likely cysts. Further characterization with ultrasound on a nonemergent/outpatient basis recommended. There is no hydronephrosis or nephrolithiasis on either side. The visualized ureters and the urinary bladder appear unremarkable. Stomach/Bowel: There is severe sigmoid diverticulosis without active inflammatory changes. There is moderate stool throughout the colon. There is no bowel obstruction or active inflammation. The appendix is not visualized with certainty. No  inflammatory changes identified in the right lower quadrant. Vascular/Lymphatic: Moderate aortoiliac atherosclerotic disease. The IVC is unremarkable. No portal venous gas. There is no adenopathy. Reproductive: The uterus is anteverted and grossly unremarkable. No adnexal masses. Other: None Musculoskeletal: Osteopenia with degenerative changes of the spine. No acute osseous pathology. IMPRESSION: 1. No acute intra-abdominal or pelvic pathology. 2. Severe sigmoid diverticulosis.  No bowel obstruction. 3. Left renal angiomyolipoma. 4.  Aortic Atherosclerosis (ICD10-I70.0). Electronically Signed   By: Anner Crete M.D.   On: 12/08/2021 21:38    Procedures Procedures    Medications Ordered in ED Medications  ondansetron (ZOFRAN-ODT) disintegrating tablet 4 mg (4 mg Oral Given 12/08/21 1147)  ondansetron (ZOFRAN) injection 4 mg (4 mg Intravenous Given 12/08/21 2222)  morphine (PF) 2 MG/ML injection 2 mg (2 mg Intravenous Given 12/08/21 2222)  sodium chloride 0.9 % bolus 500 mL (500 mLs Intravenous Bolus 12/08/21 2222)    ED Course/ Medical Decision Making/ A&P                           Medical Decision Making Amount and/or Complexity of Data Reviewed Independent Historian:     Details: Son External Data Reviewed: notes. Labs:     Details: Normal WBC, normal urinalysis, mildly low potassium and sodium. Radiology: ordered and independent interpretation performed.    Details: CT without ureteral stone, SBO, or diverticulitis.  Risk Prescription drug management.   Patient with acute low back pain for about 1 week. Probably muscular based on no other significant findings. Pain also seems to be worse with certain movements. Exam and history are not consistent with spinal cord emergency. Abdominal pain is pretty mild, and has negative CT. Doubt aortic dissection.  Urinalysis is negative and while she is on antibiotics, reportedly her urine culture from a few days ago was negative as well.   She does have some dysuria but with her vomiting I wonder if this is more of a dehydration related complaint.  I think is reasonable to continue her Keflex and finish off the prescription but I do not think changing antibiotics is warranted.  We will replete her potassium with some oral K-Dur.  Otherwise, we will give a short course of pain medicine and Zofran at home.  We will have her follow-up with PCP and we discussed return precautions.  She does appear to have an angiomyolipoma on the left kidney but this appears to be stable compared to outpatient imaging according to radiology, which I cannot access.        Final Clinical Impression(s) / ED Diagnoses  Final diagnoses:  Acute midline low back pain without sciatica    Rx / DC Orders ED Discharge Orders          Ordered    HYDROcodone-acetaminophen (NORCO) 5-325 MG tablet  Every 6 hours PRN        12/08/21 2244    ondansetron (ZOFRAN-ODT) 4 MG disintegrating tablet  Every 8 hours PRN        12/08/21 2244              Sherwood Gambler, MD 12/08/21 2303

## 2021-12-08 NOTE — ED Triage Notes (Signed)
Per EMS- patient is from home. Patient has been having lower back pain x 1 week. Patient went to an UC 3 days ago and was prescribed antibiotics for a UTI. Today, the patient c/o lower abdominal pain, nausea x 2 days.

## 2021-12-08 NOTE — Discharge Instructions (Addendum)
If you develop worsening, recurrent, or continued back pain, numbness or weakness in the legs, incontinence of your bowels or bladders, numbness of your buttocks, fever, abdominal pain, or any other new/concerning symptoms then return to the ER for evaluation.  

## 2021-12-16 DIAGNOSIS — K5900 Constipation, unspecified: Secondary | ICD-10-CM | POA: Diagnosis not present

## 2021-12-16 DIAGNOSIS — E871 Hypo-osmolality and hyponatremia: Secondary | ICD-10-CM | POA: Diagnosis not present

## 2021-12-16 DIAGNOSIS — M549 Dorsalgia, unspecified: Secondary | ICD-10-CM | POA: Diagnosis not present

## 2021-12-22 DIAGNOSIS — I129 Hypertensive chronic kidney disease with stage 1 through stage 4 chronic kidney disease, or unspecified chronic kidney disease: Secondary | ICD-10-CM | POA: Diagnosis not present

## 2021-12-22 DIAGNOSIS — E871 Hypo-osmolality and hyponatremia: Secondary | ICD-10-CM | POA: Diagnosis not present

## 2021-12-23 ENCOUNTER — Telehealth: Payer: Self-pay

## 2021-12-23 NOTE — Telephone Encounter (Signed)
   Reason for call: ED-Follow up call  Patient visit on 12/08/2021 at [facility] was for back pain  Have you been able to follow up with your primary care physician? - Yes  The patient was or was not able to obtain any needed medicine or equipment. - Was, Yes  Are there diet recommendations that you are having difficulty following? - No  Patient expresses understanding of discharge instructions and education provided has no other needs at this time.   [facility]  [place], [place]  Main Phone: [phone]  E-mail: [email or web address]  Website: [email or web address]

## 2022-01-01 DIAGNOSIS — E871 Hypo-osmolality and hyponatremia: Secondary | ICD-10-CM | POA: Diagnosis not present

## 2022-01-07 ENCOUNTER — Ambulatory Visit: Payer: Medicare Other | Admitting: Podiatry

## 2022-01-12 DIAGNOSIS — H5213 Myopia, bilateral: Secondary | ICD-10-CM | POA: Diagnosis not present

## 2022-01-19 ENCOUNTER — Other Ambulatory Visit: Payer: Self-pay | Admitting: Family Medicine

## 2022-01-19 ENCOUNTER — Ambulatory Visit
Admission: RE | Admit: 2022-01-19 | Discharge: 2022-01-19 | Disposition: A | Discharge status: Home / Self Care | Payer: Medicare Other | Source: Ambulatory Visit | Attending: Family Medicine | Admitting: Family Medicine

## 2022-01-19 DIAGNOSIS — E871 Hypo-osmolality and hyponatremia: Secondary | ICD-10-CM

## 2022-01-19 DIAGNOSIS — R5383 Other fatigue: Secondary | ICD-10-CM | POA: Diagnosis not present

## 2022-01-19 DIAGNOSIS — H547 Unspecified visual loss: Secondary | ICD-10-CM | POA: Diagnosis not present

## 2022-01-19 DIAGNOSIS — R42 Dizziness and giddiness: Secondary | ICD-10-CM | POA: Diagnosis not present

## 2022-01-19 DIAGNOSIS — I771 Stricture of artery: Secondary | ICD-10-CM | POA: Diagnosis not present

## 2022-01-19 DIAGNOSIS — R110 Nausea: Secondary | ICD-10-CM | POA: Diagnosis not present

## 2022-02-03 DIAGNOSIS — M109 Gout, unspecified: Secondary | ICD-10-CM | POA: Diagnosis not present

## 2022-02-03 DIAGNOSIS — R110 Nausea: Secondary | ICD-10-CM | POA: Diagnosis not present

## 2022-02-03 DIAGNOSIS — E871 Hypo-osmolality and hyponatremia: Secondary | ICD-10-CM | POA: Diagnosis not present

## 2022-02-03 DIAGNOSIS — R5383 Other fatigue: Secondary | ICD-10-CM | POA: Diagnosis not present

## 2022-02-13 ENCOUNTER — Ambulatory Visit: Payer: Medicare Other | Admitting: Podiatry

## 2022-02-13 ENCOUNTER — Encounter: Payer: Self-pay | Admitting: Podiatry

## 2022-02-13 DIAGNOSIS — M79674 Pain in right toe(s): Secondary | ICD-10-CM

## 2022-02-13 DIAGNOSIS — I129 Hypertensive chronic kidney disease with stage 1 through stage 4 chronic kidney disease, or unspecified chronic kidney disease: Secondary | ICD-10-CM

## 2022-02-13 DIAGNOSIS — M79675 Pain in left toe(s): Secondary | ICD-10-CM | POA: Diagnosis not present

## 2022-02-13 DIAGNOSIS — B351 Tinea unguium: Secondary | ICD-10-CM

## 2022-02-13 NOTE — Progress Notes (Signed)
This patient returns to my office for at risk foot care.  This patient requires this care by a professional since this patient will be at risk due to having chronic kidney disease.  This patient is unable to cut nails himself since the patient cannot reach his nails.These nails are painful walking and wearing shoes.  This patient presents for at risk foot care today.  General Appearance  Alert, conversant and in no acute stress.  Vascular  Dorsalis pedis and posterior tibial  pulses are palpable  bilaterally.  Capillary return is within normal limits  bilaterally. Temperature is within normal limits  bilaterally.  Neurologic  Senn-Weinstein monofilament wire test within normal limits  bilaterally. Muscle power within normal limits bilaterally.  Nails Thick disfigured discolored nails with subungual debris  from hallux to fifth toes bilaterally. No evidence of bacterial infection or drainage bilaterally.  Orthopedic  No limitations of motion  feet .  No crepitus or effusions noted.  No bony pathology or digital deformities noted.  HAV  B/L.  Skin  normotropic skin with no porokeratosis noted bilaterally.  No signs of infections or ulcers noted.     Onychomycosis  Pain in right toes  Pain in left toes  Consent was obtained for treatment procedures.   Mechanical debridement of nails 1-5  bilaterally performed with a nail nipper.  Filed with dremel without incident.    Return office visit    12 weeks                  Told patient to return for periodic foot care and evaluation due to potential at risk complications.   Helane Gunther DPM

## 2022-04-08 DIAGNOSIS — N183 Chronic kidney disease, stage 3 unspecified: Secondary | ICD-10-CM | POA: Diagnosis not present

## 2022-04-08 DIAGNOSIS — R110 Nausea: Secondary | ICD-10-CM | POA: Diagnosis not present

## 2022-04-08 DIAGNOSIS — M109 Gout, unspecified: Secondary | ICD-10-CM | POA: Diagnosis not present

## 2022-04-08 DIAGNOSIS — R300 Dysuria: Secondary | ICD-10-CM | POA: Diagnosis not present

## 2022-04-08 DIAGNOSIS — R5383 Other fatigue: Secondary | ICD-10-CM | POA: Diagnosis not present

## 2022-04-08 DIAGNOSIS — E871 Hypo-osmolality and hyponatremia: Secondary | ICD-10-CM | POA: Diagnosis not present

## 2022-05-22 ENCOUNTER — Ambulatory Visit: Payer: Medicare Other | Admitting: Podiatry

## 2022-05-22 DIAGNOSIS — Z0000 Encounter for general adult medical examination without abnormal findings: Secondary | ICD-10-CM | POA: Diagnosis not present

## 2022-05-22 DIAGNOSIS — E782 Mixed hyperlipidemia: Secondary | ICD-10-CM | POA: Diagnosis not present

## 2022-05-22 DIAGNOSIS — R7301 Impaired fasting glucose: Secondary | ICD-10-CM | POA: Diagnosis not present

## 2022-05-22 DIAGNOSIS — M810 Age-related osteoporosis without current pathological fracture: Secondary | ICD-10-CM | POA: Diagnosis not present

## 2022-05-22 DIAGNOSIS — F411 Generalized anxiety disorder: Secondary | ICD-10-CM | POA: Diagnosis not present

## 2022-05-22 DIAGNOSIS — I129 Hypertensive chronic kidney disease with stage 1 through stage 4 chronic kidney disease, or unspecified chronic kidney disease: Secondary | ICD-10-CM | POA: Diagnosis not present

## 2022-05-22 DIAGNOSIS — E039 Hypothyroidism, unspecified: Secondary | ICD-10-CM | POA: Diagnosis not present

## 2022-05-27 ENCOUNTER — Ambulatory Visit: Payer: Medicare Other | Admitting: Podiatry

## 2022-05-27 ENCOUNTER — Encounter: Payer: Self-pay | Admitting: Podiatry

## 2022-05-27 DIAGNOSIS — I129 Hypertensive chronic kidney disease with stage 1 through stage 4 chronic kidney disease, or unspecified chronic kidney disease: Secondary | ICD-10-CM | POA: Diagnosis not present

## 2022-05-27 DIAGNOSIS — B351 Tinea unguium: Secondary | ICD-10-CM

## 2022-05-27 DIAGNOSIS — M79675 Pain in left toe(s): Secondary | ICD-10-CM

## 2022-05-27 DIAGNOSIS — M79674 Pain in right toe(s): Secondary | ICD-10-CM | POA: Diagnosis not present

## 2022-05-27 NOTE — Progress Notes (Signed)
This patient returns to my office for at risk foot care.  This patient requires this care by a professional since this patient will be at risk due to having chronic kidney disease.  This patient is unable to cut nails himself since the patient cannot reach his nails.These nails are painful walking and wearing shoes.  This patient presents for at risk foot care today.  General Appearance  Alert, conversant and in no acute stress.  Vascular  Dorsalis pedis and posterior tibial  pulses are  weakly palpable  bilaterally.  Capillary return is within normal limits  bilaterally. Temperature is within normal limits  bilaterally.  Neurologic  Senn-Weinstein monofilament wire test within normal limits  bilaterally. Muscle power within normal limits bilaterally.  Nails Thick disfigured discolored nails with subungual debris  from hallux to fifth toes bilaterally. No evidence of bacterial infection or drainage bilaterally.  Orthopedic  No limitations of motion  feet .  No crepitus or effusions noted.  No bony pathology or digital deformities noted.  HAV  B/L.  Skin  normotropic skin with no porokeratosis noted bilaterally.  No signs of infections or ulcers noted.     Onychomycosis  Pain in right toes  Pain in left toes  Consent was obtained for treatment procedures.   Mechanical debridement of nails 1-5  bilaterally performed with a nail nipper.  Filed with dremel without incident.    Return office visit    12 weeks                  Told patient to return for periodic foot care and evaluation due to potential at risk complications.   Helane Gunther DPM

## 2022-05-28 DIAGNOSIS — K080 Exfoliation of teeth due to systemic causes: Secondary | ICD-10-CM | POA: Diagnosis not present

## 2022-06-25 DIAGNOSIS — H524 Presbyopia: Secondary | ICD-10-CM | POA: Diagnosis not present

## 2022-06-25 DIAGNOSIS — H401131 Primary open-angle glaucoma, bilateral, mild stage: Secondary | ICD-10-CM | POA: Diagnosis not present

## 2022-07-06 DIAGNOSIS — D485 Neoplasm of uncertain behavior of skin: Secondary | ICD-10-CM | POA: Diagnosis not present

## 2022-07-06 DIAGNOSIS — B079 Viral wart, unspecified: Secondary | ICD-10-CM | POA: Diagnosis not present

## 2022-07-06 DIAGNOSIS — Q828 Other specified congenital malformations of skin: Secondary | ICD-10-CM | POA: Diagnosis not present

## 2022-07-06 DIAGNOSIS — L570 Actinic keratosis: Secondary | ICD-10-CM | POA: Diagnosis not present

## 2022-07-06 DIAGNOSIS — C44319 Basal cell carcinoma of skin of other parts of face: Secondary | ICD-10-CM | POA: Diagnosis not present

## 2022-08-17 DIAGNOSIS — L570 Actinic keratosis: Secondary | ICD-10-CM | POA: Diagnosis not present

## 2022-08-26 ENCOUNTER — Encounter: Payer: Self-pay | Admitting: Podiatry

## 2022-08-26 ENCOUNTER — Ambulatory Visit: Payer: Medicare Other | Admitting: Podiatry

## 2022-08-26 DIAGNOSIS — M79674 Pain in right toe(s): Secondary | ICD-10-CM

## 2022-08-26 DIAGNOSIS — B351 Tinea unguium: Secondary | ICD-10-CM | POA: Diagnosis not present

## 2022-08-26 DIAGNOSIS — I129 Hypertensive chronic kidney disease with stage 1 through stage 4 chronic kidney disease, or unspecified chronic kidney disease: Secondary | ICD-10-CM | POA: Diagnosis not present

## 2022-08-26 DIAGNOSIS — M79675 Pain in left toe(s): Secondary | ICD-10-CM

## 2022-08-26 NOTE — Progress Notes (Signed)
This patient returns to my office for at risk foot care.  This patient requires this care by a professional since this patient will be at risk due to having chronic kidney disease.  This patient is unable to cut nails himself since the patient cannot reach his nails.These nails are painful walking and wearing shoes.  This patient presents for at risk foot care today.  General Appearance  Alert, conversant and in no acute stress.  Vascular  Dorsalis pedis and posterior tibial  pulses are  weakly palpable  bilaterally.  Capillary return is within normal limits  bilaterally. Temperature is within normal limits  bilaterally.  Neurologic  Senn-Weinstein monofilament wire test within normal limits  bilaterally. Muscle power within normal limits bilaterally.  Nails Thick disfigured discolored nails with subungual debris  from hallux to fifth toes bilaterally. No evidence of bacterial infection or drainage bilaterally.  Orthopedic  No limitations of motion  feet .  No crepitus or effusions noted.  No bony pathology or digital deformities noted.  HAV  B/L.  Skin  normotropic skin with no porokeratosis noted bilaterally.  No signs of infections or ulcers noted.     Onychomycosis  Pain in right toes  Pain in left toes  Consent was obtained for treatment procedures.   Mechanical debridement of nails 1-5  bilaterally performed with a nail nipper.  Filed with dremel without incident.    Return office visit    10  weeks                  Told patient to return for periodic foot care and evaluation due to potential at risk complications.   Helane Gunther DPM

## 2022-10-22 DIAGNOSIS — S46812A Strain of other muscles, fascia and tendons at shoulder and upper arm level, left arm, initial encounter: Secondary | ICD-10-CM | POA: Diagnosis not present

## 2022-11-03 DIAGNOSIS — C44319 Basal cell carcinoma of skin of other parts of face: Secondary | ICD-10-CM | POA: Diagnosis not present

## 2022-11-04 ENCOUNTER — Ambulatory Visit: Payer: Medicare Other | Admitting: Podiatry

## 2022-11-25 DIAGNOSIS — E782 Mixed hyperlipidemia: Secondary | ICD-10-CM | POA: Diagnosis not present

## 2022-11-25 DIAGNOSIS — I129 Hypertensive chronic kidney disease with stage 1 through stage 4 chronic kidney disease, or unspecified chronic kidney disease: Secondary | ICD-10-CM | POA: Diagnosis not present

## 2022-11-25 DIAGNOSIS — N183 Chronic kidney disease, stage 3 unspecified: Secondary | ICD-10-CM | POA: Diagnosis not present

## 2022-11-25 DIAGNOSIS — E039 Hypothyroidism, unspecified: Secondary | ICD-10-CM | POA: Diagnosis not present

## 2022-11-25 DIAGNOSIS — M810 Age-related osteoporosis without current pathological fracture: Secondary | ICD-10-CM | POA: Diagnosis not present

## 2022-11-25 DIAGNOSIS — R7301 Impaired fasting glucose: Secondary | ICD-10-CM | POA: Diagnosis not present

## 2022-12-10 ENCOUNTER — Ambulatory Visit: Payer: Medicare Other | Admitting: Podiatry

## 2022-12-10 ENCOUNTER — Encounter: Payer: Self-pay | Admitting: Podiatry

## 2022-12-10 VITALS — Ht 64.0 in | Wt 143.0 lb

## 2022-12-10 DIAGNOSIS — M79675 Pain in left toe(s): Secondary | ICD-10-CM | POA: Diagnosis not present

## 2022-12-10 DIAGNOSIS — B351 Tinea unguium: Secondary | ICD-10-CM

## 2022-12-10 DIAGNOSIS — I129 Hypertensive chronic kidney disease with stage 1 through stage 4 chronic kidney disease, or unspecified chronic kidney disease: Secondary | ICD-10-CM

## 2022-12-10 DIAGNOSIS — M79674 Pain in right toe(s): Secondary | ICD-10-CM | POA: Diagnosis not present

## 2022-12-10 NOTE — Progress Notes (Addendum)
This patient returns to my office for at risk foot care.  This patient requires this care by a professional since this patient will be at risk due to having chronic kidney disease.  This patient is unable to cut nails himself since the patient cannot reach his nails.These nails are painful walking and wearing shoes.  This patient presents for at risk foot care today.  General Appearance  Alert, conversant and in no acute stress.  Vascular  Dorsalis pedis and posterior tibial  pulses are  weakly palpable  bilaterally.  Capillary return is within normal limits  bilaterally. Temperature is within normal limits  bilaterally.  Neurologic  Senn-Weinstein monofilament wire test within normal limits  bilaterally. Muscle power within normal limits bilaterally.  Nails Thick disfigured discolored nails with subungual debris  from hallux to fifth toes bilaterally. No evidence of bacterial infection or drainage bilaterally.  Orthopedic  No limitations of motion  feet .  No crepitus or effusions noted.  No bony pathology or digital deformities noted.  HAV  B/L.  Skin  normotropic skin with no porokeratosis noted bilaterally.  No signs of infections or ulcers noted.     Onychomycosis  Pain in right toes  Pain in left toes  Consent was obtained for treatment procedures.   Mechanical debridement of nails 1-5  bilaterally performed with a nail nipper.  Filed with dremel without incident. Powerstep insoles for this patient.   Return office visit    10  weeks                  Told patient to return for periodic foot care and evaluation due to potential at risk complications.   Helane Gunther DPM

## 2022-12-29 DIAGNOSIS — H52223 Regular astigmatism, bilateral: Secondary | ICD-10-CM | POA: Diagnosis not present

## 2023-02-18 ENCOUNTER — Ambulatory Visit: Payer: Medicare Other | Admitting: Podiatry

## 2023-03-11 ENCOUNTER — Ambulatory Visit: Payer: Medicare Other | Admitting: Podiatry

## 2023-03-22 ENCOUNTER — Ambulatory Visit: Payer: Medicare Other | Admitting: Podiatry

## 2023-03-22 ENCOUNTER — Encounter: Payer: Self-pay | Admitting: Podiatry

## 2023-03-22 VITALS — Ht 64.0 in | Wt 143.0 lb

## 2023-03-22 DIAGNOSIS — I129 Hypertensive chronic kidney disease with stage 1 through stage 4 chronic kidney disease, or unspecified chronic kidney disease: Secondary | ICD-10-CM | POA: Diagnosis not present

## 2023-03-22 DIAGNOSIS — M79674 Pain in right toe(s): Secondary | ICD-10-CM | POA: Diagnosis not present

## 2023-03-22 DIAGNOSIS — B351 Tinea unguium: Secondary | ICD-10-CM

## 2023-03-22 DIAGNOSIS — M79675 Pain in left toe(s): Secondary | ICD-10-CM

## 2023-03-22 NOTE — Progress Notes (Signed)
This patient returns to my office for at risk foot care.  This patient requires this care by a professional since this patient will be at risk due to having chronic kidney disease.  This patient is unable to cut nails himself since the patient cannot reach his nails.These nails are painful walking and wearing shoes.  This patient presents for at risk foot care today.  General Appearance  Alert, conversant and in no acute stress.  Vascular  Dorsalis pedis and posterior tibial  pulses are  weakly palpable  bilaterally.  Capillary return is within normal limits  bilaterally. Temperature is within normal limits  bilaterally.  Neurologic  Senn-Weinstein monofilament wire test within normal limits  bilaterally. Muscle power within normal limits bilaterally.  Nails Thick disfigured discolored nails with subungual debris  from hallux to fifth toes bilaterally. No evidence of bacterial infection or drainage bilaterally.  Orthopedic  No limitations of motion  feet .  No crepitus or effusions noted.  No bony pathology or digital deformities noted.  HAV  B/L.  Skin  normotropic skin with no porokeratosis noted bilaterally.  No signs of infections or ulcers noted.     Onychomycosis  Pain in right toes  Pain in left toes  Consent was obtained for treatment procedures.   Mechanical debridement of nails 1-5  bilaterally performed with a nail nipper.  Filed with dremel without incident. Powerstep insoles for this patient.   Return office visit    10  weeks                  Told patient to return for periodic foot care and evaluation due to potential at risk complications.   Helane Gunther DPM

## 2023-05-27 DIAGNOSIS — I129 Hypertensive chronic kidney disease with stage 1 through stage 4 chronic kidney disease, or unspecified chronic kidney disease: Secondary | ICD-10-CM | POA: Diagnosis not present

## 2023-05-27 DIAGNOSIS — Z0000 Encounter for general adult medical examination without abnormal findings: Secondary | ICD-10-CM | POA: Diagnosis not present

## 2023-05-27 DIAGNOSIS — E039 Hypothyroidism, unspecified: Secondary | ICD-10-CM | POA: Diagnosis not present

## 2023-05-27 DIAGNOSIS — M1990 Unspecified osteoarthritis, unspecified site: Secondary | ICD-10-CM | POA: Diagnosis not present

## 2023-05-27 DIAGNOSIS — Z1389 Encounter for screening for other disorder: Secondary | ICD-10-CM | POA: Diagnosis not present

## 2023-05-27 DIAGNOSIS — E782 Mixed hyperlipidemia: Secondary | ICD-10-CM | POA: Diagnosis not present

## 2023-05-27 DIAGNOSIS — Z1331 Encounter for screening for depression: Secondary | ICD-10-CM | POA: Diagnosis not present

## 2023-05-27 DIAGNOSIS — R7301 Impaired fasting glucose: Secondary | ICD-10-CM | POA: Diagnosis not present

## 2023-05-27 DIAGNOSIS — N183 Chronic kidney disease, stage 3 unspecified: Secondary | ICD-10-CM | POA: Diagnosis not present

## 2023-06-02 ENCOUNTER — Ambulatory Visit: Payer: Medicare Other | Admitting: Podiatry

## 2023-06-02 ENCOUNTER — Encounter: Payer: Self-pay | Admitting: Podiatry

## 2023-06-02 DIAGNOSIS — B351 Tinea unguium: Secondary | ICD-10-CM | POA: Diagnosis not present

## 2023-06-02 DIAGNOSIS — M79674 Pain in right toe(s): Secondary | ICD-10-CM

## 2023-06-02 DIAGNOSIS — M79675 Pain in left toe(s): Secondary | ICD-10-CM | POA: Diagnosis not present

## 2023-06-02 DIAGNOSIS — I129 Hypertensive chronic kidney disease with stage 1 through stage 4 chronic kidney disease, or unspecified chronic kidney disease: Secondary | ICD-10-CM

## 2023-06-02 NOTE — Progress Notes (Signed)
 This patient returns to my office for at risk foot care.  This patient requires this care by a professional since this patient will be at risk due to having chronic kidney disease.  This patient is unable to cut nails himself since the patient cannot reach his nails.These nails are painful walking and wearing shoes.  This patient presents for at risk foot care today.  General Appearance  Alert, conversant and in no acute stress.  Vascular  Dorsalis pedis and posterior tibial  pulses are  weakly palpable  bilaterally.  Capillary return is within normal limits  bilaterally. Temperature is within normal limits  bilaterally.  Neurologic  Senn-Weinstein monofilament wire test within normal limits  bilaterally. Muscle power within normal limits bilaterally.  Nails Thick disfigured discolored nails with subungual debris  from hallux to fifth toes bilaterally. No evidence of bacterial infection or drainage bilaterally.  Orthopedic  No limitations of motion  feet .  No crepitus or effusions noted.  No bony pathology or digital deformities noted.  HAV  B/L.  Skin  normotropic skin with no porokeratosis noted bilaterally.  No signs of infections or ulcers noted.     Onychomycosis  Pain in right toes  Pain in left toes  Consent was obtained for treatment procedures.   Mechanical debridement of nails 1-5  bilaterally performed with a nail nipper.  Filed with dremel without incident.    Return office visit    10  weeks                  Told patient to return for periodic foot care and evaluation due to potential at risk complications. Told patient to use 3/4 spenco orthoses.   Ruffin Cotton DPM

## 2023-06-29 DIAGNOSIS — H40013 Open angle with borderline findings, low risk, bilateral: Secondary | ICD-10-CM | POA: Diagnosis not present

## 2023-08-11 ENCOUNTER — Encounter: Payer: Self-pay | Admitting: Podiatry

## 2023-08-11 ENCOUNTER — Ambulatory Visit: Admitting: Podiatry

## 2023-08-11 DIAGNOSIS — M79674 Pain in right toe(s): Secondary | ICD-10-CM

## 2023-08-11 DIAGNOSIS — I129 Hypertensive chronic kidney disease with stage 1 through stage 4 chronic kidney disease, or unspecified chronic kidney disease: Secondary | ICD-10-CM | POA: Diagnosis not present

## 2023-08-11 DIAGNOSIS — M79675 Pain in left toe(s): Secondary | ICD-10-CM | POA: Diagnosis not present

## 2023-08-11 DIAGNOSIS — B351 Tinea unguium: Secondary | ICD-10-CM | POA: Diagnosis not present

## 2023-08-11 NOTE — Progress Notes (Signed)
This patient returns to my office for at risk foot care.  This patient requires this care by a professional since this patient will be at risk due to having chronic kidney disease.  This patient is unable to cut nails himself since the patient cannot reach his nails.These nails are painful walking and wearing shoes.  This patient presents for at risk foot care today.  General Appearance  Alert, conversant and in no acute stress.  Vascular  Dorsalis pedis and posterior tibial  pulses are  weakly palpable  bilaterally.  Capillary return is within normal limits  bilaterally. Temperature is within normal limits  bilaterally.  Neurologic  Senn-Weinstein monofilament wire test within normal limits  bilaterally. Muscle power within normal limits bilaterally.  Nails Thick disfigured discolored nails with subungual debris  from hallux to fifth toes bilaterally. No evidence of bacterial infection or drainage bilaterally.  Orthopedic  No limitations of motion  feet .  No crepitus or effusions noted.  No bony pathology or digital deformities noted.  HAV  B/L.  Skin  normotropic skin with no porokeratosis noted bilaterally.  No signs of infections or ulcers noted.     Onychomycosis  Pain in right toes  Pain in left toes  Consent was obtained for treatment procedures.   Mechanical debridement of nails 1-5  bilaterally performed with a nail nipper.  Filed with dremel without incident.    Return office visit    10  weeks                  Told patient to return for periodic foot care and evaluation due to potential at risk complications.   Helane Gunther DPM

## 2023-10-27 ENCOUNTER — Ambulatory Visit: Admitting: Podiatry

## 2023-11-10 ENCOUNTER — Encounter: Payer: Self-pay | Admitting: Podiatry

## 2023-11-10 ENCOUNTER — Ambulatory Visit (INDEPENDENT_AMBULATORY_CARE_PROVIDER_SITE_OTHER): Admitting: Podiatry

## 2023-11-10 DIAGNOSIS — B351 Tinea unguium: Secondary | ICD-10-CM | POA: Diagnosis not present

## 2023-11-10 DIAGNOSIS — I129 Hypertensive chronic kidney disease with stage 1 through stage 4 chronic kidney disease, or unspecified chronic kidney disease: Secondary | ICD-10-CM

## 2023-11-10 DIAGNOSIS — M79675 Pain in left toe(s): Secondary | ICD-10-CM

## 2023-11-10 DIAGNOSIS — M79674 Pain in right toe(s): Secondary | ICD-10-CM | POA: Diagnosis not present

## 2023-11-10 NOTE — Progress Notes (Signed)
This patient returns to my office for at risk foot care.  This patient requires this care by a professional since this patient will be at risk due to having chronic kidney disease.  This patient is unable to cut nails himself since the patient cannot reach his nails.These nails are painful walking and wearing shoes.  This patient presents for at risk foot care today.  General Appearance  Alert, conversant and in no acute stress.  Vascular  Dorsalis pedis and posterior tibial  pulses are  weakly palpable  bilaterally.  Capillary return is within normal limits  bilaterally. Temperature is within normal limits  bilaterally.  Neurologic  Senn-Weinstein monofilament wire test within normal limits  bilaterally. Muscle power within normal limits bilaterally.  Nails Thick disfigured discolored nails with subungual debris  from hallux to fifth toes bilaterally. No evidence of bacterial infection or drainage bilaterally.  Orthopedic  No limitations of motion  feet .  No crepitus or effusions noted.  No bony pathology or digital deformities noted.  HAV  B/L.  Skin  normotropic skin with no porokeratosis noted bilaterally.  No signs of infections or ulcers noted.     Onychomycosis  Pain in right toes  Pain in left toes  Consent was obtained for treatment procedures.   Mechanical debridement of nails 1-5  bilaterally performed with a nail nipper.  Filed with dremel without incident.    Return office visit    10  weeks                  Told patient to return for periodic foot care and evaluation due to potential at risk complications.   Helane Gunther DPM

## 2023-11-29 DIAGNOSIS — E039 Hypothyroidism, unspecified: Secondary | ICD-10-CM | POA: Diagnosis not present

## 2023-11-29 DIAGNOSIS — E782 Mixed hyperlipidemia: Secondary | ICD-10-CM | POA: Diagnosis not present

## 2023-11-29 DIAGNOSIS — R7301 Impaired fasting glucose: Secondary | ICD-10-CM | POA: Diagnosis not present

## 2023-11-29 DIAGNOSIS — N183 Chronic kidney disease, stage 3 unspecified: Secondary | ICD-10-CM | POA: Diagnosis not present

## 2023-11-29 DIAGNOSIS — M810 Age-related osteoporosis without current pathological fracture: Secondary | ICD-10-CM | POA: Diagnosis not present

## 2023-11-29 DIAGNOSIS — Z23 Encounter for immunization: Secondary | ICD-10-CM | POA: Diagnosis not present

## 2023-11-29 DIAGNOSIS — I129 Hypertensive chronic kidney disease with stage 1 through stage 4 chronic kidney disease, or unspecified chronic kidney disease: Secondary | ICD-10-CM | POA: Diagnosis not present

## 2023-12-29 DIAGNOSIS — H52223 Regular astigmatism, bilateral: Secondary | ICD-10-CM | POA: Diagnosis not present

## 2024-01-20 ENCOUNTER — Ambulatory Visit: Admitting: Podiatry

## 2024-01-20 ENCOUNTER — Encounter: Payer: Self-pay | Admitting: Podiatry

## 2024-01-20 DIAGNOSIS — M79674 Pain in right toe(s): Secondary | ICD-10-CM | POA: Diagnosis not present

## 2024-01-20 DIAGNOSIS — I129 Hypertensive chronic kidney disease with stage 1 through stage 4 chronic kidney disease, or unspecified chronic kidney disease: Secondary | ICD-10-CM

## 2024-01-20 DIAGNOSIS — B351 Tinea unguium: Secondary | ICD-10-CM | POA: Diagnosis not present

## 2024-01-20 DIAGNOSIS — M79675 Pain in left toe(s): Secondary | ICD-10-CM

## 2024-01-20 NOTE — Progress Notes (Signed)
This patient returns to my office for at risk foot care.  This patient requires this care by a professional since this patient will be at risk due to having chronic kidney disease.  This patient is unable to cut nails himself since the patient cannot reach his nails.These nails are painful walking and wearing shoes.  This patient presents for at risk foot care today.  General Appearance  Alert, conversant and in no acute stress.  Vascular  Dorsalis pedis and posterior tibial  pulses are  weakly palpable  bilaterally.  Capillary return is within normal limits  bilaterally. Temperature is within normal limits  bilaterally.  Neurologic  Senn-Weinstein monofilament wire test within normal limits  bilaterally. Muscle power within normal limits bilaterally.  Nails Thick disfigured discolored nails with subungual debris  from hallux to fifth toes bilaterally. No evidence of bacterial infection or drainage bilaterally.  Orthopedic  No limitations of motion  feet .  No crepitus or effusions noted.  No bony pathology or digital deformities noted.  HAV  B/L.  Skin  normotropic skin with no porokeratosis noted bilaterally.  No signs of infections or ulcers noted.     Onychomycosis  Pain in right toes  Pain in left toes  Consent was obtained for treatment procedures.   Mechanical debridement of nails 1-5  bilaterally performed with a nail nipper.  Filed with dremel without incident.    Return office visit    10  weeks                  Told patient to return for periodic foot care and evaluation due to potential at risk complications.   Helane Gunther DPM

## 2024-01-27 DIAGNOSIS — K080 Exfoliation of teeth due to systemic causes: Secondary | ICD-10-CM | POA: Diagnosis not present

## 2024-03-29 ENCOUNTER — Ambulatory Visit: Admitting: Podiatry
